# Patient Record
Sex: Female | Born: 1985 | Race: White | Hispanic: No | Marital: Married | Smoking: Never smoker
Health system: Southern US, Community
[De-identification: ages and names within clinical notes are randomized; demographics above are authoritative.]

## PROBLEM LIST (undated history)

## (undated) ENCOUNTER — Inpatient Hospital Stay (HOSPITAL_COMMUNITY): Payer: Self-pay

## (undated) DIAGNOSIS — K819 Cholecystitis, unspecified: Secondary | ICD-10-CM

## (undated) DIAGNOSIS — F419 Anxiety disorder, unspecified: Secondary | ICD-10-CM

## (undated) DIAGNOSIS — L309 Dermatitis, unspecified: Secondary | ICD-10-CM

## (undated) DIAGNOSIS — Z9889 Other specified postprocedural states: Secondary | ICD-10-CM

## (undated) DIAGNOSIS — IMO0002 Reserved for concepts with insufficient information to code with codable children: Secondary | ICD-10-CM

## (undated) DIAGNOSIS — F32A Depression, unspecified: Secondary | ICD-10-CM

## (undated) DIAGNOSIS — E063 Autoimmune thyroiditis: Secondary | ICD-10-CM

## (undated) DIAGNOSIS — Z98891 History of uterine scar from previous surgery: Secondary | ICD-10-CM

## (undated) DIAGNOSIS — K8020 Calculus of gallbladder without cholecystitis without obstruction: Secondary | ICD-10-CM

## (undated) DIAGNOSIS — N390 Urinary tract infection, site not specified: Secondary | ICD-10-CM

## (undated) DIAGNOSIS — G903 Multi-system degeneration of the autonomic nervous system: Secondary | ICD-10-CM

## (undated) DIAGNOSIS — E282 Polycystic ovarian syndrome: Secondary | ICD-10-CM

## (undated) DIAGNOSIS — D649 Anemia, unspecified: Secondary | ICD-10-CM

## (undated) DIAGNOSIS — D696 Thrombocytopenia, unspecified: Secondary | ICD-10-CM

## (undated) DIAGNOSIS — Z6791 Unspecified blood type, Rh negative: Secondary | ICD-10-CM

## (undated) DIAGNOSIS — I951 Orthostatic hypotension: Secondary | ICD-10-CM

## (undated) DIAGNOSIS — O26899 Other specified pregnancy related conditions, unspecified trimester: Secondary | ICD-10-CM

## (undated) DIAGNOSIS — R87619 Unspecified abnormal cytological findings in specimens from cervix uteri: Secondary | ICD-10-CM

## (undated) DIAGNOSIS — R112 Nausea with vomiting, unspecified: Secondary | ICD-10-CM

## (undated) DIAGNOSIS — F329 Major depressive disorder, single episode, unspecified: Secondary | ICD-10-CM

## (undated) DIAGNOSIS — R55 Syncope and collapse: Secondary | ICD-10-CM

## (undated) DIAGNOSIS — O99119 Other diseases of the blood and blood-forming organs and certain disorders involving the immune mechanism complicating pregnancy, unspecified trimester: Secondary | ICD-10-CM

## (undated) DIAGNOSIS — N189 Chronic kidney disease, unspecified: Secondary | ICD-10-CM

## (undated) DIAGNOSIS — Z8619 Personal history of other infectious and parasitic diseases: Secondary | ICD-10-CM

## (undated) HISTORY — PX: DILATION AND CURETTAGE OF UTERUS: SHX78

## (undated) HISTORY — DX: Orthostatic hypotension: I95.1

## (undated) HISTORY — DX: Major depressive disorder, single episode, unspecified: F32.9

## (undated) HISTORY — DX: Depression, unspecified: F32.A

## (undated) HISTORY — DX: Autoimmune thyroiditis: E06.3

## (undated) HISTORY — PX: APPENDECTOMY: SHX54

## (undated) HISTORY — DX: Personal history of other infectious and parasitic diseases: Z86.19

## (undated) HISTORY — DX: Multi-system degeneration of the autonomic nervous system: G90.3

## (undated) HISTORY — DX: Cholecystitis, unspecified: K81.9

## (undated) HISTORY — DX: Dermatitis, unspecified: L30.9

## (undated) HISTORY — DX: Polycystic ovarian syndrome: E28.2

## (undated) HISTORY — DX: Unspecified abnormal cytological findings in specimens from cervix uteri: R87.619

## (undated) HISTORY — DX: Reserved for concepts with insufficient information to code with codable children: IMO0002

## (undated) HISTORY — DX: Chronic kidney disease, unspecified: N18.9

## (undated) HISTORY — DX: Calculus of gallbladder without cholecystitis without obstruction: K80.20

## (undated) HISTORY — DX: Syncope and collapse: R55

---
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
## 2005-07-23 ENCOUNTER — Ambulatory Visit: Payer: Self-pay | Admitting: "Endocrinology

## 2005-10-01 ENCOUNTER — Other Ambulatory Visit: Admission: RE | Admit: 2005-10-01 | Discharge: 2005-10-01 | Payer: Self-pay | Admitting: Obstetrics and Gynecology

## 2009-09-15 ENCOUNTER — Ambulatory Visit: Payer: Self-pay | Admitting: Diagnostic Radiology

## 2009-09-15 ENCOUNTER — Emergency Department (HOSPITAL_BASED_OUTPATIENT_CLINIC_OR_DEPARTMENT_OTHER): Admission: EM | Admit: 2009-09-15 | Discharge: 2009-09-15 | Payer: Self-pay | Admitting: Emergency Medicine

## 2009-12-19 ENCOUNTER — Encounter: Payer: Self-pay | Admitting: Internal Medicine

## 2009-12-26 ENCOUNTER — Ambulatory Visit (HOSPITAL_COMMUNITY): Admission: RE | Admit: 2009-12-26 | Discharge: 2009-12-26 | Payer: Self-pay | Admitting: Obstetrics and Gynecology

## 2010-02-28 ENCOUNTER — Encounter: Payer: Self-pay | Admitting: Internal Medicine

## 2010-03-01 DIAGNOSIS — R5381 Other malaise: Secondary | ICD-10-CM

## 2010-03-01 DIAGNOSIS — F3289 Other specified depressive episodes: Secondary | ICD-10-CM | POA: Insufficient documentation

## 2010-03-01 DIAGNOSIS — R519 Headache, unspecified: Secondary | ICD-10-CM | POA: Insufficient documentation

## 2010-03-01 DIAGNOSIS — E079 Disorder of thyroid, unspecified: Secondary | ICD-10-CM | POA: Insufficient documentation

## 2010-03-01 DIAGNOSIS — R5383 Other fatigue: Secondary | ICD-10-CM

## 2010-03-01 DIAGNOSIS — R51 Headache: Secondary | ICD-10-CM

## 2010-03-01 DIAGNOSIS — F329 Major depressive disorder, single episode, unspecified: Secondary | ICD-10-CM | POA: Insufficient documentation

## 2010-03-01 DIAGNOSIS — J45909 Unspecified asthma, uncomplicated: Secondary | ICD-10-CM | POA: Insufficient documentation

## 2010-03-04 ENCOUNTER — Ambulatory Visit: Payer: Self-pay | Admitting: Cardiology

## 2010-03-04 DIAGNOSIS — I951 Orthostatic hypotension: Secondary | ICD-10-CM | POA: Insufficient documentation

## 2010-03-04 DIAGNOSIS — R002 Palpitations: Secondary | ICD-10-CM | POA: Insufficient documentation

## 2010-03-13 ENCOUNTER — Encounter: Payer: Self-pay | Admitting: Internal Medicine

## 2010-03-15 ENCOUNTER — Encounter: Payer: Self-pay | Admitting: Cardiology

## 2010-03-15 ENCOUNTER — Ambulatory Visit: Payer: Self-pay | Admitting: Internal Medicine

## 2010-03-15 ENCOUNTER — Ambulatory Visit (HOSPITAL_COMMUNITY): Admission: RE | Admit: 2010-03-15 | Discharge: 2010-03-15 | Payer: Self-pay | Admitting: Cardiology

## 2010-03-15 ENCOUNTER — Ambulatory Visit: Payer: Self-pay

## 2010-03-18 ENCOUNTER — Telehealth: Payer: Self-pay | Admitting: Cardiology

## 2010-03-21 ENCOUNTER — Ambulatory Visit: Payer: Self-pay

## 2010-03-21 ENCOUNTER — Ambulatory Visit: Payer: Self-pay | Admitting: Internal Medicine

## 2010-03-21 DIAGNOSIS — R55 Syncope and collapse: Secondary | ICD-10-CM

## 2010-03-27 ENCOUNTER — Encounter: Payer: Self-pay | Admitting: Cardiology

## 2010-03-27 ENCOUNTER — Telehealth (INDEPENDENT_AMBULATORY_CARE_PROVIDER_SITE_OTHER): Payer: Self-pay | Admitting: *Deleted

## 2010-03-27 ENCOUNTER — Ambulatory Visit: Payer: Self-pay

## 2010-03-27 ENCOUNTER — Telehealth: Payer: Self-pay | Admitting: Internal Medicine

## 2010-06-17 ENCOUNTER — Ambulatory Visit: Payer: Self-pay | Admitting: Internal Medicine

## 2010-06-17 DIAGNOSIS — E663 Overweight: Secondary | ICD-10-CM | POA: Insufficient documentation

## 2010-10-21 ENCOUNTER — Ambulatory Visit: Payer: Self-pay | Admitting: Diagnostic Radiology

## 2010-10-21 ENCOUNTER — Observation Stay (HOSPITAL_COMMUNITY): Admission: EM | Admit: 2010-10-21 | Discharge: 2010-10-22 | Payer: Self-pay | Admitting: Emergency Medicine

## 2010-10-21 ENCOUNTER — Encounter: Payer: Self-pay | Admitting: Emergency Medicine

## 2010-10-21 ENCOUNTER — Encounter (INDEPENDENT_AMBULATORY_CARE_PROVIDER_SITE_OTHER): Payer: Self-pay

## 2011-01-09 NOTE — Progress Notes (Signed)
Summary: c/o dizziness  Phone Note Call from Patient Call back at Home Phone [phone] Call back at Work Phone [phone] Call back at c   Caller: Patient Reason for Call: Talk to Nurse Summary of Call: pt not wearing her heart monitor now., it won't connect to phone. what should she do. c/o lighthead, dizziness. pt at work now.  Initial call taken by: Lorne Skeens,  March 27, 2010 2:58 PM  Follow-up for Phone Call        I spoke with the pt and she is sitting in a chair at work and is really dizzy and lightheaded.  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 I told the pt that she could come into our office for an EKG.  The pt said she gets off at 4 and if she is still having symptoms she would have a coworker drive her to our office.  The pt will call back to let me know if she is coming in for an EKG. I instructed the pt to call the customer service number that came with her monitor and they would be able to help her with getting monitor connected to phone line.  Julieta Gutting, RN, BSN  March 27, 2010 3:03 PM  The pt came into the office for a nurse visit. Follow-up by: Julieta Gutting, RN, BSN,  March 27, 2010 4:04 PM

## 2011-01-09 NOTE — Progress Notes (Signed)
  LOV faxed over to [facility] to [phone] [facility]  March 27, 2010 4:34 PM

## 2011-01-09 NOTE — Assessment & Plan Note (Signed)
Summary: per check out/sf   Visit Type:  Follow-up Primary Provider:  Dr. Holley Bouche   History of Present Illness: The patient presents today for routine electrophysiology followup. She reports doing very well since last being seen in our clinic.  Her dizziness resolved with florinef.  She has however noticed fluid retention and bloating with florinef. The patient denies symptoms of palpitations, chest pain, shortness of breath, orthopnea, PND, l dizziness, presyncope, syncope, or neurologic sequela. The patient is tolerating medications without difficulties and is otherwise without complaint today.   Current Medications (verified): 1)  Alprazolam 0.25 Mg Tabs (Alprazolam) .Marland Kitchen.. 1 Tab As Needed 2)  Ventolin Hfa 108 (90 Base) Mcg/act Aers (Albuterol Sulfate) .... Use As Directed As Needed 3)  Fludrocortisone Acetate 0.1 Mg Tabs (Fludrocortisone Acetate) .... One By Mouth Bid 4)  Levothyroxine Sodium 25 Mcg Tabs (Levothyroxine Sodium) .... Once Daily  Allergies: 1)  ! Pcn 2)  ! * Antibiotics 3)  ! Ceclor 4)  ! Macrobid (Nitrofurantoin Monohyd Macro) 5)  ! 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 R knee surgery 2001 D&C after miscarriage 1/11  Social History: Reviewed history from 03/21/2010 and no changes required. Pts lives in [place].  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  Works at [employer].  Graduated from [employer] 2008. Engaged Tobacco Use - No.  Alcohol Use - rare Drug Use - no  Vital Signs:  Patient profile:   25 year old female Height:      61 inches Weight:      166 pounds BMI:     31.48 Pulse rate:   89 / minute BP sitting:   112 / 68  (left arm)  Vitals Entered By: Laurance Flatten CMA (June 17, 2010 3:49 PM)  Physical Exam  General:  Well  developed, well nourished, in no acute distress. Head:  normocephalic and atraumatic Eyes:  PERRLA/EOM intact; conjunctiva and lids normal. Mouth:  Teeth, gums and palate normal. Oral mucosa normal. Neck:  Neck supple, no JVD. No masses, thyromegaly or abnormal cervical nodes. Lungs:  Clear bilaterally to auscultation and percussion. Heart:  Non-displaced PMI, chest non-tender; regular rate and rhythm, S1, S2 without murmurs, rubs or gallops. Carotid upstroke normal, no bruit. Normal abdominal aortic size, no bruits. Femorals normal pulses, no bruits. Pedals normal pulses. No edema, no varicosities. Abdomen:  Bowel sounds positive; abdomen soft and non-tender without masses, organomegaly, or hernias noted. No hepatosplenomegaly. Msk:  Back normal, normal gait. Muscle strength and tone normal. Pulses:  pulses normal in all 4 extremities Extremities:  No clubbing or cyanosis. Neurologic:  Alert and oriented x 3.   Event Monitor  Procedure date:  04/10/2010  Findings:      sinus rhythm no arrhythmias  Impression & Recommendations:  Problem # 1:  ORTHOSTATIC HYPOTENSION (ICD-458.0) Dizziness and orthostasis have resolved with florinef, however she has bloating. We will decrease florinef to 0.1mg  every monday, wed, and friday. She will continue to push fluid and liberalize her salt. She should avoid hot weather.  Problem # 2:  OVERWEIGHT (ICD-278.02) weight loss and regular exercise encouraged  Patient Instructions: 1)  Your physician recommends that you schedule a follow-up appointment in: 3 months with Dr Johney Frame 2)  Your physician has recommended you make the following change in your medication: decrease Florinef to one tablet on M/W/F only

## 2011-01-09 NOTE — Progress Notes (Signed)
Summary: returning call  Phone Note Call from Patient Call back at [phone]    Reason for Call: Talk to Nurse Summary of Call: returning call Initial call taken by: Migdalia Dk,  March 18, 2010 11:17 AM  Follow-up for Phone Call        Phone Call Completed  PT AWARE OF ECHO RESULTS. Follow-up by: Scherrie Bateman, LPN,  March 18, 2010 11:42 AM

## 2011-01-09 NOTE — Assessment & Plan Note (Signed)
Summary: np6/ strong fm hx heart disease, heart failure, heart racing ...   Visit Type:  np 6 Primary Provider:  Dr. Holley Bouche  CC:  chest pain, sob, heart racing , fainting, and weight 45 lb in one yr.  History of Present Illness: [name] comes in today for evaluation and management of history of heart racing, daily she is awake, shortness of breath, low blood pressure, dizziness and presyncope.  She is a 25 year old single white female who comes with her mother today with the above complaints.  She tries to do a treadmill elliptical training 10-20 minutes 3 times a week. She notes her heart races. She's not had syncope.  Her weight can change as much as 3-5 pounds in a day. She is a salt eater. She been gaining weight despite going to [employer].  She gets occasionally dizzy when she stands up too quickly. She has not fainted.  She complains of chronic headaches and atypical chest pain.  She's very concerned about the family history of heart disease. However this not first-degree relatives affected. I reassured her and her mother today she does not carry this shadow.  She is a primary care patient of Dr. Tiburcio Pea. Blood work was essentially normal including a comprehensive metabolic panel, CBC, and TSH. Her free T4 was 0.5 9 which is borderline low. These labs were just checked this past week.  She does have a history of Hashimoto's disease.  She has an uncle who had V. tach and mitral valve prolapse.    Current Medications (verified): 1)  Alprazolam 0.25 Mg Tabs (Alprazolam) .Marland Kitchen.. 1 Tab As Needed 2)  Ventolin Hfa 108 (90 Base) Mcg/act Aers (Albuterol Sulfate) .... Use As Directed As Needed 3)  Cipro 500 Mg Tabs (Ciprofloxacin Hcl) .Marland Kitchen.. 1 Two Times A Day  Allergies (verified): 1)  ! Pcn 2)  ! * Antibiotics 3)  ! Ceclor 4)  ! Macrobid (Nitrofurantoin Monohyd Macro)  Past History:  Past Medical History: Last updated: 03/01/2010 SYNCOPE (ICD-780.2) CORONARY  ARTERY DISEASE, FAMILY HX (ICD-V17.3) DIZZINESS (ICD-780.4) HEADACHE (ICD-784.0) FATIGUE (ICD-780.79) THYROID DISORDER (ICD-246.9) ASTHMA (ICD-493.90) DEPRESSION (ICD-311)    Social History: Last updated: 03/01/2010 Tobacco Use - No.  Alcohol Use - no Drug Use - no  Risk Factors: Smoking Status: never (03/01/2010)  Review of Systems       negative other than history of present illness  Vital Signs:  Patient profile:   25 year old female Height:      61 inches Weight:      164 pounds BMI:     31.10 Pulse rate:   84 / minute BP sitting:   83 / 59  (left arm) Cuff size:   large  Vitals Entered By: Oswald Hillock (March 04, 2010 12:49 PM)  Serial Vital Signs/Assessments:  Time      Position  BP       Pulse  Resp  Temp     By 12:57 PM            83/59    [address]                    Oswald Hillock                     102/68   [address]                    Oswald Hillock  88/65    81                    Oswald Hillock   Physical Exam  General:  obese.  obese.   Head:  normocephalic and atraumatic Eyes:  PERRLA/EOM intact; conjunctiva and lids normal. Neck:  Neck supple, no JVD. No masses, thyromegaly or abnormal cervical nodes. Chest Prosper Paff:  no deformities or breast masses noted Lungs:  Clear bilaterally to auscultation and percussion. Heart:  Non-displaced PMI, chest non-tender; regular rate and rhythm, S1, S2 without murmurs, rubs or gallops. Carotid upstroke normal, no bruit. Normal abdominal aortic size, no bruits. Femorals normal pulses, no bruits. Pedals normal pulses. No edema, no varicosities. Abdomen:  Bowel sounds positive; abdomen soft and non-tender without masses, organomegaly, or hernias noted. No hepatosplenomegaly. Msk:  Back normal, normal gait. Muscle strength and tone normal. Pulses:  pulses normal in all 4 extremities Extremities:  trace left pedal edema and trace right pedal edema.  trace left pedal edema.   Neurologic:   Alert and oriented x 3. Skin:  Intact without lesions or rashes. Psych:  Normal affect.   Problems:  Medical Problems Added: 1)  Dx of Palpitations  (ICD-785.1) 2)  Dx of Orthostatic Hypotension  (ICD-458.0)  EKG  Procedure date:  03/04/2010  Findings:      normal sinus rhythm, normal PR QRS and QTC intervals. T Wave inversion in V3 and biphasic T.'s in V4 through V5.  Impression & Recommendations:  Problem # 1:  PALPITATIONS (ICD-785.1) I suspect these are benign and perhaps related to sinus tachycardia with exercise. I do not think it is associated with any organic heart disease or thyroid disease. I explained that to her mother today. We'll obtain a 2-D echocardiogram. If this is normal reassurance to be given.  Problem # 2:  ORTHOSTATIC HYPOTENSION (ICD-458.0) Her blood pressure is low at baseline. She does increase her heart rate with standing but brings her pressure up initially. I suspect she somewhat dehydrated. Encourage free water and limiting caffeine.  Other Orders: EKG w/ Interpretation (93000) Echocardiogram (Echo)  Patient Instructions: 1)  Your physician recommends that you schedule a follow-up appointment in: as needed 2)  Your physician recommends that you continue on your current medications as directed. Please refer to the Current Medication list given to you today. 3)  Your physician has requested that you have an echocardiogram.  Echocardiography is a painless test that uses sound waves to create images of your heart. It provides your doctor with information about the size and shape of your heart and how well your heart's chambers and valves are working.  This procedure takes approximately one hour. There are no restrictions for this procedure.CUT BACK ON SALT INTAKE 4)  INCREASE WATER INTAKE NO CAFFIENE

## 2011-01-09 NOTE — Consult Note (Signed)
Summary: [facility]   Imported By: Marylou Mccoy 05/15/2010 14:18:08  _____________________________________________________________________  External Attachment:    Type:   Image     Comment:   External Document

## 2011-01-09 NOTE — Consult Note (Signed)
Summary: Tracey Reyes   Imported By: Marylou Mccoy 05/15/2010 14:14:10  _____________________________________________________________________  External Attachment:    Type:   Image     Comment:   External Document

## 2011-01-09 NOTE — Assessment & Plan Note (Signed)
Summary: dizziness  Nurse Visit   Vital Signs:  Patient profile:   25 year old female Pulse rate:   78 / minute Resp:     12 per minute BP sitting:   90 / 68  (left arm) Cuff size:   regular  Vitals Entered By: Julieta Gutting, RN, BSN (March 27, 2010 4:00 PM)  Current Medications (verified): 1)  Alprazolam 0.25 Mg Tabs (Alprazolam) .Marland Kitchen.. 1 Tab As Needed 2)  Ventolin Hfa 108 (90 Base) Mcg/act Aers (Albuterol Sulfate) .... Use As Directed As Needed 3)  Fludrocortisone Acetate 0.1 Mg Tabs (Fludrocortisone Acetate) .... One By Mouth Bid  Allergies: 1)  ! Pcn 2)  ! * Antibiotics 3)  ! Ceclor 4)  ! Macrobid (Nitrofurantoin Monohyd Macro) 5)  ! Sulfa  Impression & Recommendations:  Problem # 1:  ORTHOSTATIC HYPOTENSION (ICD-458.0) The pt came into the office today because she was at work and developed dizziness while bending over.  The pt was concerned because she was not wearing her heart monitor (having difficulty with phone line).  The pt had her mother drive her to our office for an EKG.  EKG shows NSR at 78 bpm.  When reviewing the pt's medications she states that she has not taken her Florinef today or yesterday.  I spoke with Dr Shirlee Latch (DOD) about this pt and he said the pt needs to take her Florinef as instructed, remain hydrated and liberalize salt in her diet.    Julieta Gutting, RN, BSN  March 27, 2010 5:03 PM    Patient Instructions: 1)  Your physician recommends that you continue on your current medications as directed. Please refer to the Current Medication list given to you today. (Take your Florinef as instructed) 2)  Please call the customer service number provided with your heart monitor to discuss issues with phone line.  3)  Remain well hydrated and liberalize the salt in your diet.

## 2011-01-09 NOTE — Consult Note (Signed)
Summary: [facility]   Imported By: Marylou Mccoy 05/15/2010 14:13:39  _____________________________________________________________________  External Attachment:    Type:   Image     Comment:   External Document

## 2011-01-09 NOTE — Assessment & Plan Note (Signed)
Summary: NEP/ APPT IS 1:45/ HYPOTENSION, SYNCOPE / PT HAS [employer]/ GD   Visit Type:  Initial Consult Primary Provider:  Dr. Holley Bouche  CC:  nep/hypotension and syncope.  Marland Kitchen  History of Present Illness: Tracey Reyes is a pleasant 25 yo WF without significant PMH who presents for EP consultation regarding presyncope.  She reports over the past 6 months she has had episodic dizziness.  Episodes have occured when lying and sitting but primarily with standing.    Episodes are most pronounced when changing from supine to standing position.  Episodes are frequently worse in the early morning.  She reports that she has had prior syncope.  This occured while sweeping at work 2-3 months ago.  She  had dizziness and warmth while sweeping and collapsed.  She had an episode of presyncope after receiving novacaine for a dental procedure.  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  She reports heart racing with episodes but notes that dizziness preceeds the racing. She was recently evaluated by Dr Daleen Squibb and documented to have orthostatic hypotension.  A recent echo revealed no structural abnormalities.  She is a primary care patient of Dr. Tiburcio Pea. Recent blood work in his office was essentially normal including a comprehensive metabolic panel, CBC, and TSH. Her free T4 was 0.5 9 which is borderline low.  Upon being seen by Dr Daleen Squibb, aggressive hydration and salt liberalization were advised.  She continues to have these symptoms despite this lifestyle change.  Current Medications (verified): 1)  Alprazolam 0.25 Mg Tabs (Alprazolam) .Marland Kitchen.. 1 Tab As Needed 2)  Ventolin Hfa 108 (90 Base) Mcg/act Aers (Albuterol Sulfate) .... Use As Directed As Needed  Allergies (verified): 1)  ! Pcn 2)  ! * Antibiotics 3)  ! Ceclor 4)  ! Macrobid (Nitrofurantoin Educational psychologist)  Past History:  Past Medical History: RECURRENT SYNCOPE  DIZZINESS (ICD-780.4) H/O  HOSHIMOTO'S THYROIDITIS ASTHMA (ICD-493.90) DEPRESSION (ICD-311) ECZEMA  Past Surgical History: R knee surgery 2001 D&C after miscarriage 1/11  Family History: maternal grandfather had MI  in his late 62s.  Maternal uncle has mitral valve prolapse and ventricular tachycardia.  He has an ICD.  Pts parents and siblings are healthy.  Social History: Pts lives in [place].  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  Works at [employer].  Graduated from [employer] 2008. Engaged Tobacco Use - No.  Alcohol Use - rare Drug Use - no  Review of Systems       All systems are reviewed and negative except as listed in the HPI.  struggles with chronic weight gain,  recurrent UTIs  Vital Signs:  Patient profile:   25 year old female Height:      61 inches Weight:      161 pounds Pulse rate:   88 / minute Pulse (ortho):   80 / minute Pulse rhythm:   regular BP sitting:   94 / 62  (left arm) BP standing:   99 / 72 Cuff size:   regular  Vitals Entered By: Judithe Modest CMA (March 21, 2010 2:33 PM)  Serial Vital Signs/Assessments:  Time      Position  BP       Pulse  Resp  Temp     By 3:42 PM   Lying RA  90/63    70                    Judithe Modest CMA  3:42 PM   Sitting   102/71   78                    Judithe Modest CMA 3:42 PM   Standing  99/72    80                    Judithe Modest CMA  Comments: 3:42 PM 2 minutes--129/92 HR 91 3 minutes--121/93 HR 92  Pt reports dizziness and blurred vision upon sitting and first standing pressure.  Pt then reports shakiness and numbness along with the last  2 standing pressures.   By: Judithe Modest CMA    Physical Exam  General:  overweight, NAD Head:  normocephalic and atraumatic Eyes:  PERRLA/EOM intact; conjunctiva and lids normal. Mouth:  Teeth, gums and palate normal. Oral mucosa normal. Neck:  Neck supple, no JVD. No masses, thyromegaly or abnormal cervical nodes. Lungs:  Clear bilaterally to auscultation and percussion. Heart:   Non-displaced PMI, chest non-tender; regular rate and rhythm, S1, S2 without murmurs, rubs or gallops. Carotid upstroke normal, no bruit. Normal abdominal aortic size, no bruits. Femorals normal pulses, no bruits. Pedals normal pulses. No edema, no varicosities. Abdomen:  Bowel sounds positive; abdomen soft and non-tender without masses, organomegaly, or hernias noted. No hepatosplenomegaly. Msk:  Back normal, normal gait. Muscle strength and tone normal. Pulses:  pulses normal in all 4 extremities Extremities:  No clubbing or cyanosis. Neurologic:  Alert and oriented x 3. Skin:  Intact without lesions or rashes. Cervical Nodes:  no significant adenopathy Psych:  Normal affect.  Echocardiogram  Procedure date:  03/15/2010  Findings:       Study Conclusions            - Left ventricle: Systolic function was vigorous. The estimated       ejection fraction was in the range of 65% to 70%. Wall motion was       normal; there were no regional wall motion abnormalities. Features       are consistent with a pseudonormal left ventricular filling       pattern, with concomitant abnormal relaxation and increased       filling pressure (grade 2 diastolic dysfunction).     - Aortic valve: Valve area: 1.86cm 2(VTI). Valve area: 1.7cm 2       (Vmax).     Transthoracic echocardiography. M-mode, complete 2D, spectral     Doppler, and color Doppler. Height: Height: 154.9cm. Height: 61in.     Weight: Weight: 74.4kg. Weight: 163.7lb. Body mass index: BMI:     31kg/m 2. Body surface area: BSA: 1.37m 2. Blood pressure: 122/83.     Patient status: Outpatient. Location: [facility]            --------------------------------------------------------------------            --------------------------------------------------------------------     Left ventricle: Systolic function was vigorous. The estimated     ejection fraction was in the range of 65% to 70%. Wall motion was     normal; there were no  regional wall motion abnormalities. Features     are consistent with a pseudonormal left ventricular filling pattern,     with concomitant abnormal relaxation and increased filling pressure     (grade 2 diastolic dysfunction).            --------------------------------------------------------------------     Aortic valve: Structurally normal valve. Cusp separation was normal.     Doppler: Transvalvular velocity  was within the normal range. There     was no stenosis. No regurgitation.  Valve area: 1.86cm 2(VTI).     Indexed valve area: 1.07cm 2/m 2 (VTI). Valve area: 1.7cm 2 (Vmax).     Indexed valve area: 0.98cm 2/m 2 (Vmax).  Mean gradient: 4mm Hg (S).            --------------------------------------------------------------------     Aorta: Aortic root: The aortic root was normal in size     EKG  Procedure date:  03/04/2010  Findings:      sinus rhythm 84 bpm, PR 136, QRS 78, QT 410, nonspecific ST/T changes  Impression & Recommendations:  Problem # 1:  SYNCOPE (ICD-780.2) The patient reports having prior syncope.  Her symptoms are consistent with postural syncope, likely due to dysautonomia.  Though she has maternal relatives with a h/o VT, her EKG and echo are benign.  She continues to exhibit orthostasis on exam with reproduction of symptoms.  I therefore do not feel that a tilt study would add additional information at this time.  As lifestyle modification with increased salt and hydration have been ineffective, I will add florinef 0.1mg  two times a day at this time. I will also place an event monitor to evaluate for arrhythmia, though I think this is unlikely. The patient is advised to refrain from driving until symptoms are resolved.  Problem # 2:  ORTHOSTATIC HYPOTENSION (ICD-458.0) as above add florinef  Other Orders: Holter Monitor (Holter Monitor)  Patient Instructions: 1)  Your physician recommends that you schedule a follow-up appointment in: 6 weeks with Dr  Johney Frame 2)  Your physician has recommended that you wear an event monitor.  Event monitors are medical devices that record the heart's electrical activity. Doctors most often use these monitors to diagnose arrhythmias. Arrhythmias are problems with the speed or rhythm of the heartbeat. The monitor is a small, portable device. You can wear one while you do your normal daily activities. This is usually used to diagnose what is causing palpitations/syncope (passing out). 3)  Your physician has recommended you make the following change in your medication: start Florinef 0.1mg  two times a day Prescriptions: FLUDROCORTISONE ACETATE 0.1 MG TABS (FLUDROCORTISONE ACETATE) one by mouth bid  #60 x 3   Entered by:   Dennis Bast, RN, BSN   Authorized by:   Hillis Range, MD   Signed by:   Dennis Bast, RN, BSN on 03/21/2010   Method used:   Electronically to        [id number] (retail)       [address]       [place], [place]  [address]       Ph: [phone] or [phone]       Fax: [phone]   RxID:   [id number]

## 2011-01-09 NOTE — Consult Note (Signed)
Summary: [facility]   Imported By: Marylou Mccoy 05/15/2010 14:18:49  _____________________________________________________________________  External Attachment:    Type:   Image     Comment:   External Document

## 2011-02-18 LAB — URINE CULTURE
Colony Count: 9000
Culture  Setup Time: 201111142133

## 2011-02-18 LAB — URINALYSIS, ROUTINE W REFLEX MICROSCOPIC
Bilirubin Urine: NEGATIVE
Glucose, UA: NEGATIVE mg/dL
Ketones, ur: NEGATIVE mg/dL
Nitrite: NEGATIVE
Protein, ur: NEGATIVE mg/dL
Specific Gravity, Urine: 1.022 (ref 1.005–1.030)
Urobilinogen, UA: 0.2 mg/dL (ref 0.0–1.0)
pH: 7 (ref 5.0–8.0)

## 2011-02-18 LAB — CBC
HCT: 43 % (ref 36.0–46.0)
MCH: 29.6 pg (ref 26.0–34.0)
MCV: 86.5 fL (ref 78.0–100.0)
Platelets: 179 10*3/uL (ref 150–400)
RDW: 11.4 % — ABNORMAL LOW (ref 11.5–15.5)
WBC: 18 10*3/uL — ABNORMAL HIGH (ref 4.0–10.5)

## 2011-02-18 LAB — COMPREHENSIVE METABOLIC PANEL
ALT: 21 U/L (ref 0–35)
Albumin: 4.4 g/dL (ref 3.5–5.2)
BUN: 14 mg/dL (ref 6–23)
CO2: 26 mEq/L (ref 19–32)
Calcium: 9.7 mg/dL (ref 8.4–10.5)
Chloride: 102 mEq/L (ref 96–112)
Creatinine, Ser: 0.9 mg/dL (ref 0.4–1.2)
GFR calc Af Amer: >60 mL/min (ref >60)
GFR calc non Af Amer: >60 mL/min (ref >60)
Potassium: 3.6 mEq/L (ref 3.5–5.1)
Total Protein: 7.8 g/dL (ref 6.0–8.3)

## 2011-02-18 LAB — PREGNANCY, URINE: Preg Test, Ur: NEGATIVE

## 2011-02-18 LAB — DIFFERENTIAL
Eosinophils Absolute: 0.2 10*3/uL (ref 0.0–0.7)
Eosinophils Relative: 1 % (ref 0–5)
Neutro Abs: 14.4 10*3/uL — ABNORMAL HIGH (ref 1.7–7.7)
Neutrophils Relative %: 80 % — ABNORMAL HIGH (ref 43–77)

## 2011-02-18 LAB — WET PREP, GENITAL
Clue Cells Wet Prep HPF POC: NONE SEEN
Trich, Wet Prep: NONE SEEN
Yeast Wet Prep HPF POC: NONE SEEN

## 2011-02-18 LAB — URINE MICROSCOPIC-ADD ON

## 2011-02-24 LAB — ABO/RH: ABO/RH(D): B NEG

## 2011-02-24 LAB — CBC
MCHC: 33.4 g/dL (ref 30.0–36.0)
MCV: 89 fL (ref 78.0–100.0)
Platelets: 221 10*3/uL (ref 150–400)
RBC: 5.06 MIL/uL (ref 3.87–5.11)

## 2011-02-24 LAB — RH IMMUNE GLOBULIN WORKUP (NOT WOMEN'S HOSP): ABO/RH(D): B NEG

## 2011-04-01 ENCOUNTER — Other Ambulatory Visit (HOSPITAL_COMMUNITY): Payer: Self-pay | Admitting: Obstetrics and Gynecology

## 2011-04-01 DIAGNOSIS — N6314 Unspecified lump in the right breast, lower inner quadrant: Secondary | ICD-10-CM

## 2011-04-03 ENCOUNTER — Other Ambulatory Visit: Payer: Self-pay

## 2011-04-04 ENCOUNTER — Ambulatory Visit
Admission: RE | Admit: 2011-04-04 | Discharge: 2011-04-04 | Disposition: A | Discharge status: Home / Self Care | Payer: BC Managed Care – PPO | Source: Ambulatory Visit | Attending: Obstetrics and Gynecology | Admitting: Obstetrics and Gynecology

## 2011-04-04 DIAGNOSIS — N6314 Unspecified lump in the right breast, lower inner quadrant: Secondary | ICD-10-CM

## 2011-04-20 IMAGING — CT CT ABD-PELV W/ CM
2 of 4 series · 17 of 46 positions shown, 19 images · IV contrast (APPLIED)
Comparison: None.

CLINICAL DATA: Right lower quadrant abdominal pain.  Fever.
Nausea.  Chills.  Leukocytosis.  Negative urine pregnancy test.

CT ABDOMEN AND PELVIS WITH CONTRAST 10/21/2010:
TECHNIQUE: Multidetector CT imaging of the abdomen and pelvis was
performed following the standard protocol during bolus
administration of intravenous contrast.
Contrast: 100 ml [id number] IV.  Water utilized as oral
contrast.

[Series 2: abd/pelvis 5.0 b31f · axial · 0.88mm/px · z∈[-475,-25]mm · 14 of 100 slices shown, 16 images]
[im 5/100  soft-tissue]
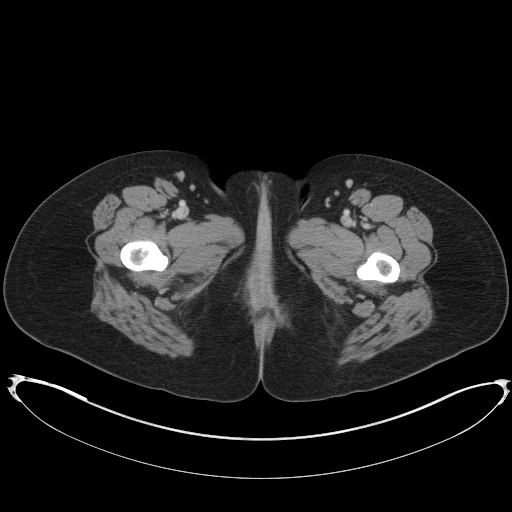
[im 5/100  bone]
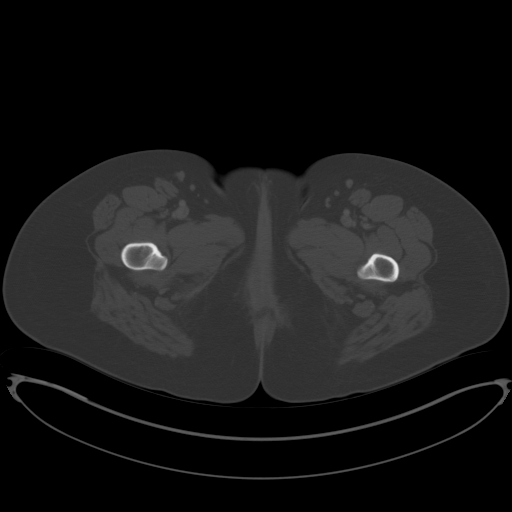
[im 14/100  soft-tissue]
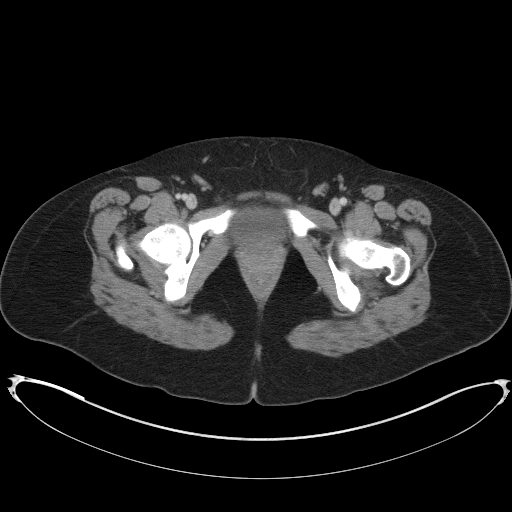
[im 19/100  soft-tissue]
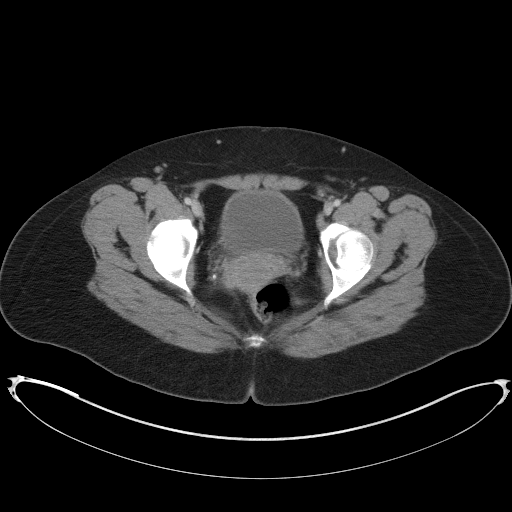
[im 28/100  soft-tissue]
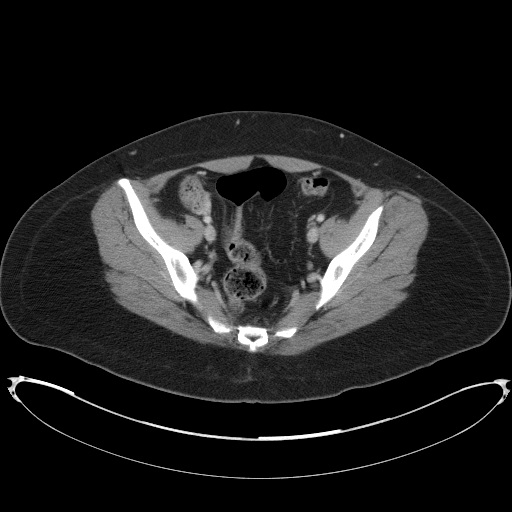
[im 32/100  soft-tissue]
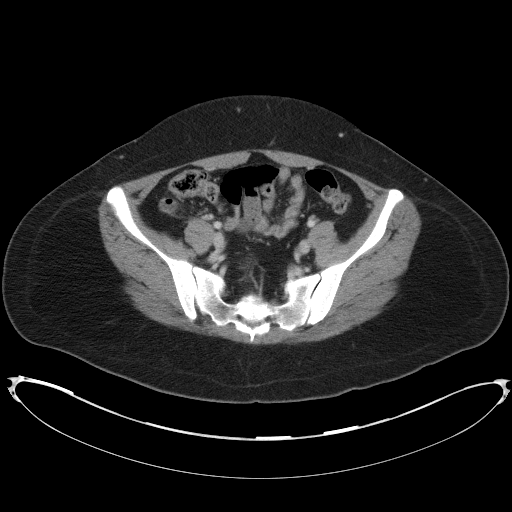
[im 41/100  soft-tissue]
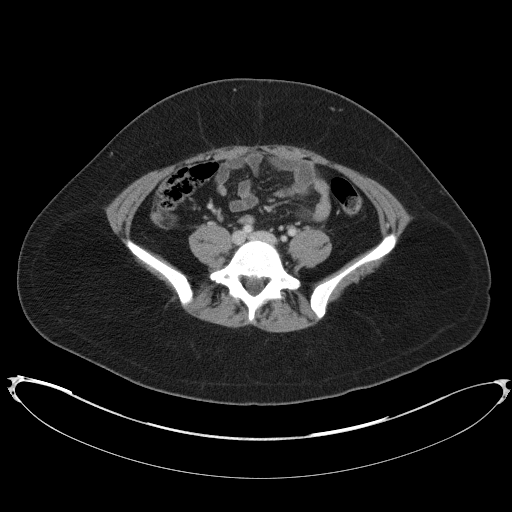
[im 46/100  soft-tissue]
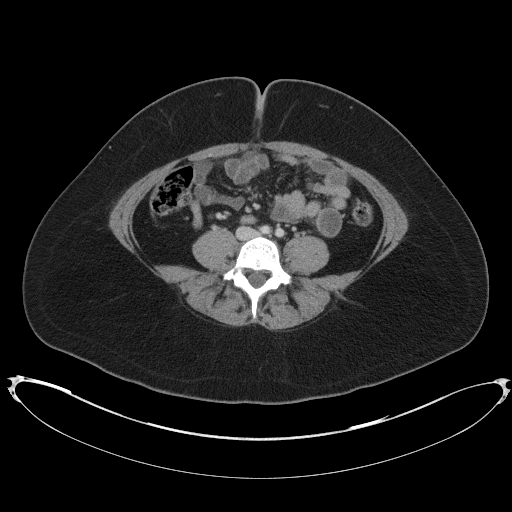
[im 55/100  soft-tissue]
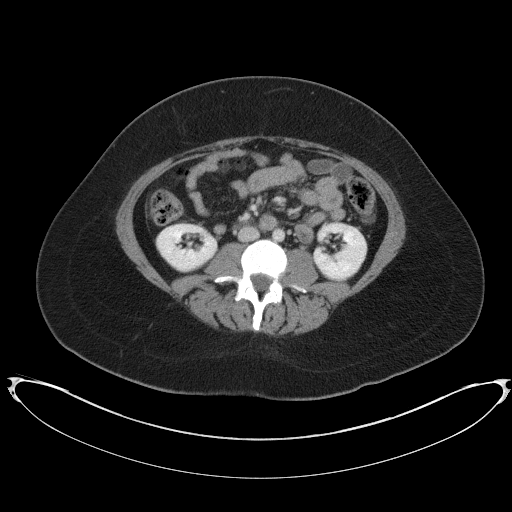
[im 59/100  soft-tissue]
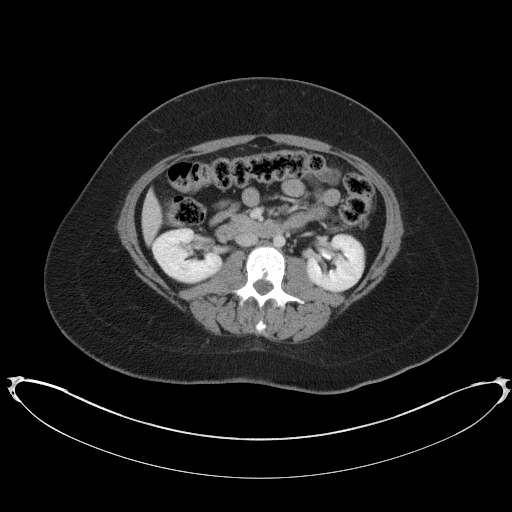
[im 59/100  bone]
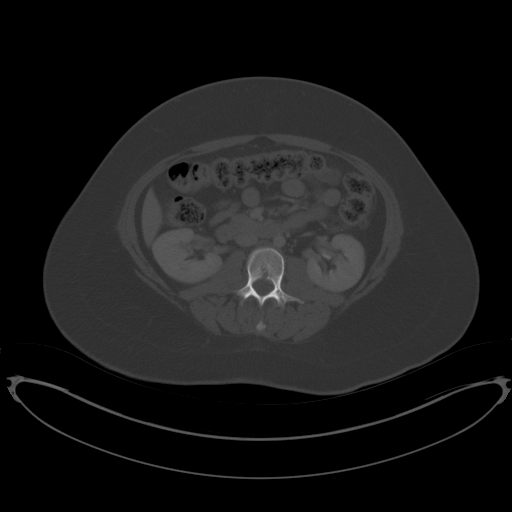
[im 68/100  soft-tissue]
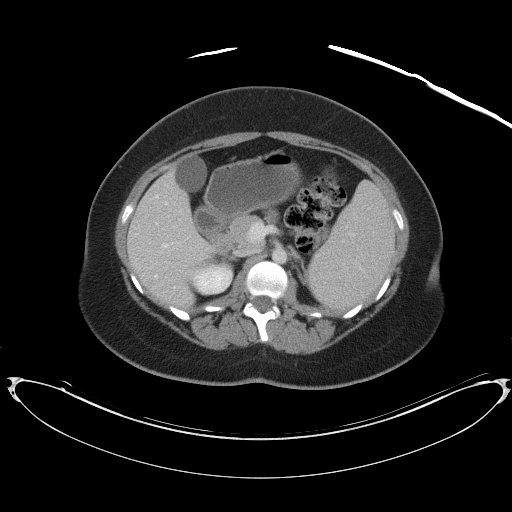
[im 73/100  soft-tissue]
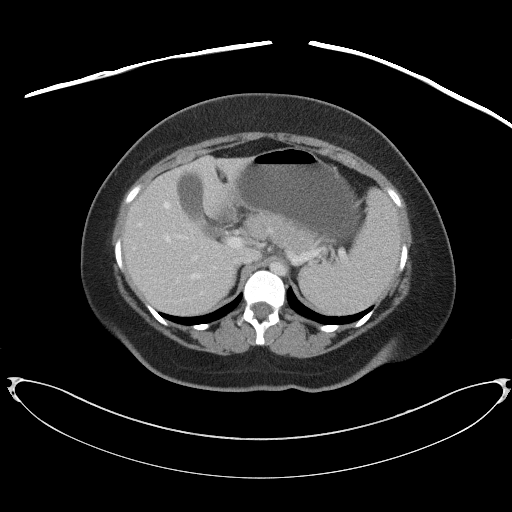
[im 82/100  soft-tissue]
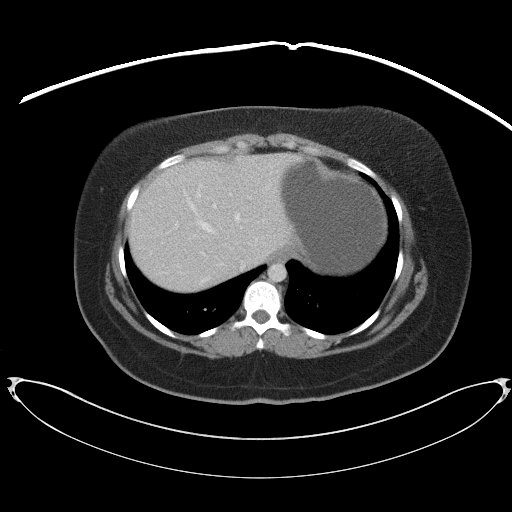
[im 86/100  soft-tissue]
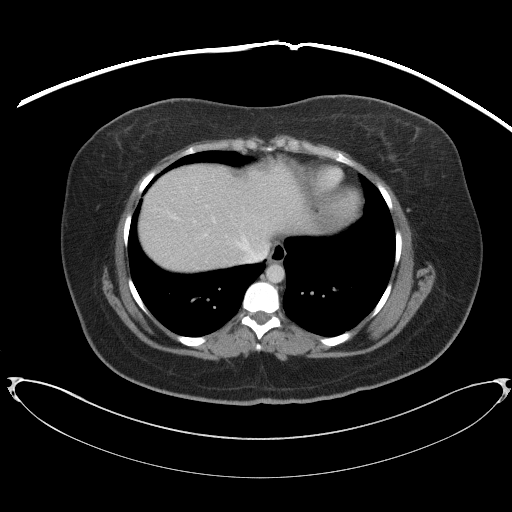
[im 95/100  soft-tissue]
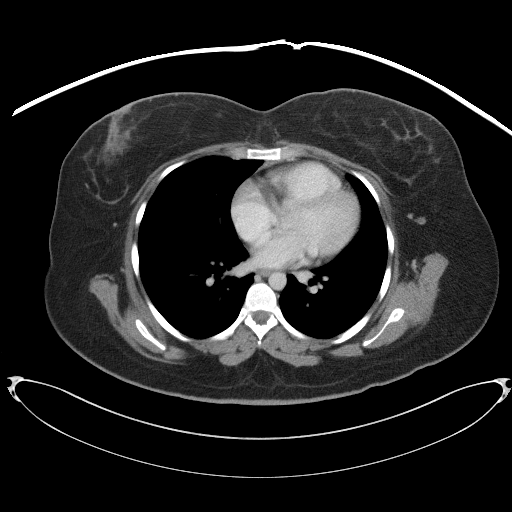

[Series 5: abd/pelvis 3.0 coronal · coronal · 0.97mm/px · 3 of 98 slices shown]
[im 33/98  soft-tissue]
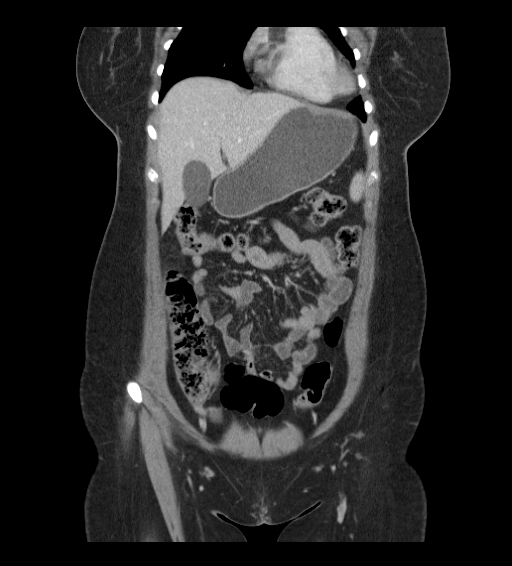
[im 44/98  soft-tissue]
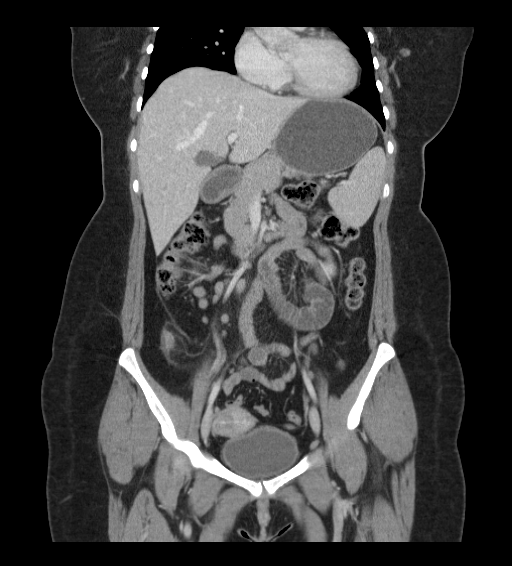
[im 54/98  soft-tissue]
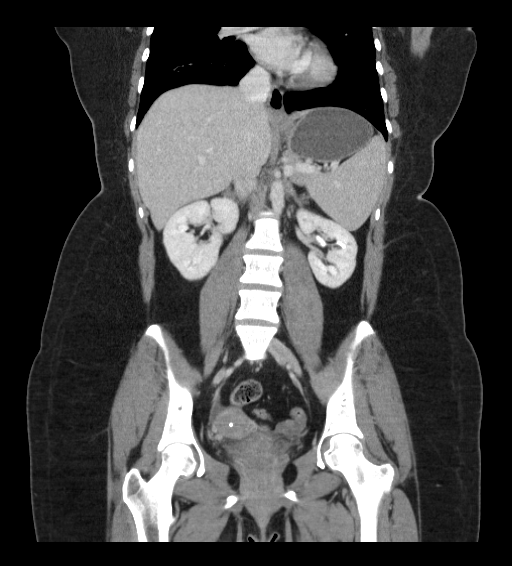

[17 of 46 positions shown; findings below may reference images not displayed]

FINDINGS: Dilated, fluid-filled appendix measuring up to
approximately 13 mm.  Enhancement of the appendiceal mucosa.
Periappendiceal inflammation and edema.  No abnormal fluid
collection to suggest abscess.  No extraluminal gas.  Small
reactive lymph nodes in the adjacent mesentery.

Normal appearing liver, spleen, pancreas, and adrenal glands.
Normal left kidney.  Incomplete rotation of the normal-appearing
right kidney.  Stomach, small bowel, and colon normal in
appearance.  No ascites.  No significant lymphadenopathy.  No
visible atherosclerosis.

Urinary bladder decompressed unremarkable.  Normal appearing uterus
containing an intrauterine device.  Normal-appearing ovaries for
age.  No free pelvic fluid.  Bone window images unremarkable.
Visualized lung bases clear.  Heart size normal.
IMPRESSION: 1.  Acute appendicitis.  No evidence of periappendiceal abscess.
2.  No significant abnormalities otherwise.

Critical test results telephoned to Dr. Casale of the emergency
department by me at the time of interpretation on 10/21/2010 at
2562 hours.

## 2011-04-25 NOTE — Op Note (Signed)
[place]  Patient:    Tracey Reyes, Tracey Reyes                 MRN: [id number] Proc. Date: 03/02/01 Adm. Date:  [id number] Attending:  Marcene Corning                           Operative Report  PREOPERATIVE DIAGNOSIS:  Right knee chondromalacia of the patella.  POSTOPERATIVE DIAGNOSIS:  Right knee chondromalacia of the patella.  PROCEDURES: 1. Right knee arthroscopic chondroplasty. 2. Right knee arthroscopic lateral release.  ANESTHESIA:  General.  SURGEON:  Lubertha Basque. Jerl Santos, M.D.  ASSISTANT:  Prince Rome, P.A.  INDICATION FOR PROCEDURE:  The patient is a 25 year old girl with a very long history of anterior knee pain, right worse than left.  This has persisted despite oral anti-inflammatories, bracing, and a long course of physical therapy.  At this point, her knee is limiting her activities, and she has left discomfort as well.  She has been offered operative intervention to consist of an arthroscopy.  The procedure was discussed with the patient and her mother, and informed operative consent was obtained after discussion of the possible complications of, reaction to anesthesia, and infection.  DESCRIPTION OF PROCEDURE:  The patient was taken to the operating suite, where general anesthetic was applied without difficulty.  She was positioned supine and prepped and draped in normal sterile fashion.  After the administration of preoperative IV antibiotics, an arthroscopy of the right knee was performed through a total of three portals.  Suprapatellar pouch was benign, while the patellofemoral joint exhibited some significant softening of the medial patellar facet.  It did track in a lateral position.  The rest of the joint was examined and was normal.  The medial and lateral compartments exhibited no evidence of meniscal or articular cartilage injury.  The ACL and PCL were intact.  Through the additional portal, an arthroscopic  lateral release was performed.  Care was taken to perform Bovie cautery of any bleeding vessels seen.  Once this was accomplished, the patella tracked in a normal position. The knee was thoroughly irrigated at the end of the case, followed by placement of Marcaine with epinephrine and morphine.  Adaptic was placed over the portals, followed by dry gauze and a loose Ace wrap.  Estimated blood loss and intraoperative fluids can be obtained from the anesthesia records.  DISPOSITION:  The patient was extubated in the operating room and taken to the recovery room in stable condition.  Plans were for her to go home the same day and to follow up in the office in less than a week.  I will contact her by phone tonight. DD:  03/02/01 TD:  03/02/01 Job: [id number]

## 2011-06-12 ENCOUNTER — Other Ambulatory Visit: Payer: Self-pay | Admitting: Gastroenterology

## 2011-06-12 DIAGNOSIS — R1013 Epigastric pain: Secondary | ICD-10-CM

## 2011-06-13 ENCOUNTER — Encounter: Payer: Self-pay | Admitting: Internal Medicine

## 2011-06-18 ENCOUNTER — Other Ambulatory Visit: Payer: BC Managed Care – PPO

## 2011-06-19 ENCOUNTER — Other Ambulatory Visit: Payer: BC Managed Care – PPO

## 2011-07-10 ENCOUNTER — Ambulatory Visit
Admission: RE | Admit: 2011-07-10 | Discharge: 2011-07-10 | Disposition: A | Discharge status: Home / Self Care | Payer: 59 | Source: Ambulatory Visit | Attending: Gastroenterology | Admitting: Gastroenterology

## 2011-07-10 DIAGNOSIS — R1013 Epigastric pain: Secondary | ICD-10-CM

## 2011-07-17 ENCOUNTER — Ambulatory Visit (INDEPENDENT_AMBULATORY_CARE_PROVIDER_SITE_OTHER): Payer: 59 | Admitting: Surgery

## 2011-07-17 ENCOUNTER — Encounter (INDEPENDENT_AMBULATORY_CARE_PROVIDER_SITE_OTHER): Payer: Self-pay | Admitting: Surgery

## 2011-07-17 VITALS — BP 104/76 | HR 76 | Temp 97.8°F | Ht 60.75 in | Wt 165.0 lb

## 2011-07-17 DIAGNOSIS — K8020 Calculus of gallbladder without cholecystitis without obstruction: Secondary | ICD-10-CM

## 2011-07-17 NOTE — Progress Notes (Addendum)
Chief Complaint  Patient presents with  . Other    urg- gallstones    Tracey Reyes is a 25 y.o. white female who is a patient of HARRIS,RANDALL, MD (also sees Dr. Marcy Siren) and comes to me today for gall bladder disease.  The patient has had trouble with reflux disease which began last fall. She had a laparoscopic appendectomy by Dr. Derrell Lolling on 21 October 2010.  It seemed for a while that her reflux disease got better after the appendectomy.  She then developed worsening bloating and stomach cramps. Pasta and bread seemed to precipitate these symptoms the most.  She was referred to see Dr. Wandalee Ferdinand in early July. He obtained an ultrasound that showed gallstones. She is also set up for an upper endoscopy by Dr. Evette Cristal, Wednesday, 23 July 2011.  She had sushi last PM and had a severe abdominal cramps.  The pain was mid epigastric.  Gastrointestinal history: She has some test to evaluate her for celiac disease. These are reported as negative. She has no history of liver disease, pancreas disease, or colon disease. Her father and grandfather both had gallbladder disease.  Past Medical History  Diagnosis Date  . Orthostatic hypotension   . 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 Thyroiditis     hoshimoto's; hx   . Asthma   . Depression   . Eczema   . Fever   . Weight increase   . Chills   . PCOS (polycystic ovarian syndrome)   . Sleep difficulties   . Dizziness   . Abdominal pain   . Nausea & vomiting     Past Surgical History  Procedure Date  . Anterior cruciate ligament repair 2001  . Dilation and curettage of uterus     after miscarriage 1/11  . Appendectomy     Current Outpatient Prescriptions  Medication Sig Dispense Refill  . albuterol (VENTOLIN HFA) 108 (90 BASE) MCG/ACT inhaler Inhale into the lungs as directed. Prn        . IBUPROFEN PO Take by mouth as needed.          Allergies  Allergen Reactions  . Cefaclor     REACTION:  CECLOR  . Dexilant   . Macrobid   . Nitrofurantoin   . Penicillins   . Sulfonamide Derivatives    Review of Systems: Skin:  No history of rash.  No history of abnormal moles. Infection:  No history of hepatitis or HIV.  No history of MRSA. Neurologic:  No history of stroke.  No history of seizure.  No history of headaches. Cardiac:  No history of hypertension. No history of heart disease.   No history of seeing a cardiologist. Pulmonary:  Does not smoke cigarettes.  No asthma or bronchitis.  No OSA/CPAP.  Endocrine:  No diabetes. No thyroid disease. Gastrointestinal: See HPI. Urologic:  No history of kidney stones.  Has had multiple UTI's.  Has seen Dr. Aldean Ast since age 54. GYN:  Miscarried 2 years ago.  Diagnosed with PCOS by Dr. [name] ([facility]). Musculoskeletal:  No history of joint or back disease. Hematologic:  No bleeding disorder.  No history of anemia.  Not anticoagulated.  SOCIAL and FAMILY HISTORY: Married x 1 year.  Works for [employer] - in [employer].  PHYSICAL EXAM: BP 104/76  Pulse 76  Temp(Src) 97.8 F (36.6 C) (Temporal)  Ht 5' 0.75" (1.543 m)  Wt 165 lb (74.844 kg)  BMI 31.43 kg/m2  HEENT: Normal. Pupils equal. Normal dentition.  Stud in right nares. Neck: Supple. No thyroid mass. Lymph Nodes:  No supraclavicular or cervical nodes. Lungs: Clear and symmetric. Heart:  RRR. No murmur.  Abdomen: No mass. Soreness in left abdomen.  No hernia. Normal bowel sounds.  Scar from umbilical ring and lap appendectomy. Rectal: Not done. Extremities:  Good strength in upper and lower extremities. Neurologic:  Grossly intact to motor and sensory function.  DATA REVIEWED: Notes from Dr. Evette Cristal.  ASSESSMENT and PLAN: 1.  Symptomatic gall bladder disease.  I discussed with the patient the indications and risks of gall bladder surgery.  The primary risks of gall bladder surgery include, but are not limited to, bleeding, infection, common bile duct  injury, and open surgery.  There is also the risk that the patient may have continued symptoms after surgery. I tried to answer the patient's questions. I gave the patient literature about gall bladder surgery.  We also talked a little about Actigall.  She is to undergo an upper endoscopy by Dr. Evette Cristal and will make a decision after that.  [She called back and said upper endo by Dr. Evette Cristal was negative.  She wants to be treated with Actigall because of not being able to miss time with her job.  She will need KUB (to see if stones are radiolucent), CMP (ifnot done), and f/u US in 6 months (dn 07/25/11)]  [KUB shows no radio-opaque stones, CMP okay, treat with actigall for 6 months, then repeat US and office visit  (dn 07/28/11)]  [She's trying to get pregnant.  Is talked about taking Clomid for fertility.  She see Dr. Billy Coast from a Ob standpoint.  She's had two miscarriages.  Wants to know what to do about the GB.  She is afraid of surgery.  The risks of keeping the GB and stone are that she will have trouble during her pregnancy.  I discussed the options on the phone with the patient.  DN 08/28/2011.]  2.  PCOS 3.  History of multiple UTI's. 4.  Multiple allergies. 5.  Asthma. 6.  Anxiety.

## 2011-07-17 NOTE — Patient Instructions (Signed)
You have literature on gall bladder disease.  Avoid fatty/greasy/spicey foods.  Drink plenty of water.  You have the endo scheduled with Dr. Evette Cristal.  Call us back to schedule GB surgery, when you are ready.

## 2011-07-24 ENCOUNTER — Telehealth (INDEPENDENT_AMBULATORY_CARE_PROVIDER_SITE_OTHER): Payer: Self-pay

## 2011-07-24 NOTE — Telephone Encounter (Signed)
Pt called and stated that she had her endoscopy yesterday and it was negative.  She wants to know about a medication (?Actigall) that she can take to avoid surgery.  She can't have surgery this year due to her new job and finances.  She wants to wait until after the new year to think about surgery.  I called and requested the endoscopy report and will address this with Dr Ezzard Standing.  We will call the patient back.

## 2011-07-25 ENCOUNTER — Telehealth (INDEPENDENT_AMBULATORY_CARE_PROVIDER_SITE_OTHER): Payer: Self-pay

## 2011-07-25 ENCOUNTER — Other Ambulatory Visit (INDEPENDENT_AMBULATORY_CARE_PROVIDER_SITE_OTHER): Payer: Self-pay | Admitting: Surgery

## 2011-07-25 DIAGNOSIS — K8020 Calculus of gallbladder without cholecystitis without obstruction: Secondary | ICD-10-CM

## 2011-07-25 NOTE — Telephone Encounter (Signed)
I notified pt that Dr Ezzard Standing wants to order a KUB and lab work prior to starting the Actigall.  She wants to get the CMP drawn at her work so I will fax the order to her attention at [phone].  Once these results are  in and Dr Ezzard Standing reviews them, we will call in the medication.

## 2011-07-28 ENCOUNTER — Ambulatory Visit
Admission: RE | Admit: 2011-07-28 | Discharge: 2011-07-28 | Disposition: A | Discharge status: Home / Self Care | Payer: 59 | Source: Ambulatory Visit | Attending: Surgery | Admitting: Surgery

## 2011-07-28 ENCOUNTER — Ambulatory Visit (INDEPENDENT_AMBULATORY_CARE_PROVIDER_SITE_OTHER): Payer: Self-pay | Admitting: General Surgery

## 2011-07-28 ENCOUNTER — Telehealth (INDEPENDENT_AMBULATORY_CARE_PROVIDER_SITE_OTHER): Payer: Self-pay

## 2011-07-28 DIAGNOSIS — K8020 Calculus of gallbladder without cholecystitis without obstruction: Secondary | ICD-10-CM

## 2011-07-28 NOTE — Telephone Encounter (Signed)
Dr Ezzard Standing called and informed me her xray was ok.  Her labs are normal.  She can start Actigall 300mg  bid x83mos.  F/u in February with repeat abd [place] prior to office visit.   I notified the pt and called in the Actigall to [facility] [phone]

## 2011-08-26 ENCOUNTER — Telehealth (INDEPENDENT_AMBULATORY_CARE_PROVIDER_SITE_OTHER): Payer: Self-pay

## 2011-08-26 NOTE — Telephone Encounter (Signed)
Pt called and left me a voicemail that she saw Dr Billy Coast to discuss infertility treatment.  He wanted her to talk to you about if she can take medication for that with her GB issue or does she need to go ahead and have surgery 1st?  He wants to prescribe Clomid.  She is doing on the Actigall and is not as bloated.  Dr Billy Coast told her the gallstones could get worse.   Her work # is [phone] ext [phone] or cell [id number].

## 2011-08-29 NOTE — Telephone Encounter (Signed)
Dr Ezzard Standing spoke to the pt 08-28-11

## 2011-10-29 ENCOUNTER — Ambulatory Visit: Payer: 59 | Admitting: Internal Medicine

## 2011-12-11 ENCOUNTER — Telehealth (INDEPENDENT_AMBULATORY_CARE_PROVIDER_SITE_OTHER): Payer: Self-pay | Admitting: Surgery

## 2011-12-16 ENCOUNTER — Other Ambulatory Visit (INDEPENDENT_AMBULATORY_CARE_PROVIDER_SITE_OTHER): Payer: Self-pay

## 2011-12-16 DIAGNOSIS — R1013 Epigastric pain: Secondary | ICD-10-CM

## 2011-12-23 ENCOUNTER — Ambulatory Visit
Admission: RE | Admit: 2011-12-23 | Discharge: 2011-12-23 | Disposition: A | Discharge status: Home / Self Care | Payer: 59 | Source: Ambulatory Visit | Attending: Surgery | Admitting: Surgery

## 2011-12-23 DIAGNOSIS — R1013 Epigastric pain: Secondary | ICD-10-CM

## 2012-01-08 ENCOUNTER — Telehealth (INDEPENDENT_AMBULATORY_CARE_PROVIDER_SITE_OTHER): Payer: Self-pay

## 2012-01-08 NOTE — Telephone Encounter (Signed)
The patient called and asked for refill of Actigall generic to be called to [facility] [phone].  She takes 300mg  bid.  The pt is coming in next week.  I called in 300mg  bid Actigall generic allowed to [facility] with only #30 and no refills to last her until we examine her next week.

## 2012-01-15 ENCOUNTER — Encounter (INDEPENDENT_AMBULATORY_CARE_PROVIDER_SITE_OTHER): Payer: Self-pay | Admitting: Surgery

## 2012-01-15 ENCOUNTER — Ambulatory Visit (INDEPENDENT_AMBULATORY_CARE_PROVIDER_SITE_OTHER): Payer: 59 | Admitting: Surgery

## 2012-01-15 VITALS — BP 118/64 | HR 84 | Temp 97.0°F | Resp 18 | Ht 61.0 in | Wt 171.5 lb

## 2012-01-15 DIAGNOSIS — K829 Disease of gallbladder, unspecified: Secondary | ICD-10-CM | POA: Insufficient documentation

## 2012-01-15 NOTE — Progress Notes (Signed)
[facility]  Ovidio Kin, MD,  FACS [address] [place]  [address] [place], [place]    [address] Phone:  [phone] FAX:  [phone]   Re:   Tracey Reyes DOB:   02-Mar-1986 MRN:   [id number]  ASSESSMENT AND PLAN: 1. Symptomatic gall bladder disease.   She was very anxious about surgery when I first met her and she has tried to manage this medically, with the help actigall.  She was recently thinking about having GB surgery, but now she is pregnant, so this will be put on hold.  She has taken actigall (300 mg BID) for about 6 months and this has controlled her symptoms.  He [place] on 12/23/2011 showed 4 stones.  By measurement, the stone was 10 mm in August 2012 and 9 mm on the current study.  I gave her a copy of the [place].  So the stones are not bigger, but I am not sure how much smaller they really are.  Because of her pregnancy, she will stop the actigall.  I do not need to see her during her pregnancy, unless she has trouble.  I will see her 2 or three months post delivery to discuss probable cholecystectomy.  2. PCOS  3. History of multiple UTI's.  4. Multiple allergies.  5. Asthma.  6. Anxiety. 7.  She found out she was pregnant today.  She sees Dr. Billy Coast.  I printed the FDA warning for actigall during pregnancy - category B - for her.  There is no known problem, but they just advise against using it.  We agreed that it is probably best that she not take the actigall during the pregnancy.  She has already had 2 miscarriages.  HISTORY OF PRESENT ILLNESS: Chief Complaint  Patient presents with  . Follow-up    reck GB    Tracey Reyes is a 26 y.o. (DOB: 05/06/1986)  white female who is a patient of Tiburcio Pea, Osborn Coho, MD, MD and comes to me today for follow up of gall stones and gall bladder disease.  She actually is doing very well with the actigall to manage her symptoms.  Her US showed no change in the size of the stones.  But today she found  out she is pregnant.  Review of System: Urologic: No history of kidney stones. Has had multiple UTI's. Has seen Dr. Aldean Ast since age 13.  GYN: Miscarried 2 years ago. Diagnosed with PCOS by Dr. [name] ([facility]).  PHYSICAL EXAM: BP 118/64  Pulse 84  Temp(Src) 97 F (36.1 C) (Temporal)  Resp 18  Ht 5\' 1"  (1.549 m)  Wt 171 lb 8.3 oz (77.801 kg)  BMI 32.41 kg/m2  Abdomen:  Soft.  BS positive. Umbilical ring.  No mass or tenderness.  DATA REVIEWED: I gave her copies of her [place] report. I gave her copies of the FDA warning for pregnancy.  Ovidio Kin, MD, FACS Office:  [phone]

## 2012-02-12 LAB — OB RESULTS CONSOLE RPR: RPR: NONREACTIVE

## 2012-02-12 LAB — OB RESULTS CONSOLE HEPATITIS B SURFACE ANTIGEN: Hepatitis B Surface Ag: NEGATIVE

## 2012-03-07 ENCOUNTER — Inpatient Hospital Stay (HOSPITAL_COMMUNITY)
Admission: AD | Admit: 2012-03-07 | Discharge: 2012-03-07 | Disposition: A | Discharge status: Hospice | Payer: 59 | Source: Ambulatory Visit | Attending: Obstetrics & Gynecology | Admitting: Obstetrics & Gynecology

## 2012-03-07 ENCOUNTER — Encounter (HOSPITAL_COMMUNITY): Payer: Self-pay | Admitting: *Deleted

## 2012-03-07 DIAGNOSIS — Z3490 Encounter for supervision of normal pregnancy, unspecified, unspecified trimester: Secondary | ICD-10-CM

## 2012-03-07 DIAGNOSIS — N949 Unspecified condition associated with female genital organs and menstrual cycle: Secondary | ICD-10-CM | POA: Insufficient documentation

## 2012-03-07 DIAGNOSIS — O99891 Other specified diseases and conditions complicating pregnancy: Secondary | ICD-10-CM | POA: Insufficient documentation

## 2012-03-07 LAB — URINALYSIS, ROUTINE W REFLEX MICROSCOPIC
Bilirubin Urine: NEGATIVE
Glucose, UA: NEGATIVE mg/dL
Leukocytes, UA: NEGATIVE
Nitrite: NEGATIVE
Protein, ur: NEGATIVE mg/dL
Specific Gravity, Urine: <1.005 — ABNORMAL LOW (ref 1.005–1.030)
Urobilinogen, UA: 0.2 mg/dL (ref 0.0–1.0)
pH: 6 (ref 5.0–8.0)

## 2012-03-07 LAB — URINE MICROSCOPIC-ADD ON

## 2012-03-07 NOTE — MAU Note (Signed)
Pt c/o vag pressure since 2:30 this afternoon.  No bleeding or discharge.  Pt does c/o lower abd pressure on both lower sides of abdomen.  Denies any urinary complications.

## 2012-03-07 NOTE — MAU Note (Signed)
Pt reports feeling pelvic pressure and pain that started today. Denies vaginal bleeding or discharge.

## 2012-03-07 NOTE — Discharge Instructions (Signed)
ABCs of Pregnancy A Antepartum care is very important. Be sure you see your doctor and get prenatal care as soon as you think you are pregnant. At this time, you will be tested for infection, genetic abnormalities and potential problems with you and the pregnancy. This is the time to discuss diet, exercise, work, medications, labor, pain medication during labor and the possibility of a cesarean delivery. Ask any questions that may concern you. It is important to see your doctor regularly throughout your pregnancy. Avoid exposure to toxic substances and chemicals - such as cleaning solvents, lead and mercury, some insecticides, and paint. Pregnant women should avoid exposure to paint fumes, and fumes that cause you to feel ill, dizzy or faint. When possible, it is a good idea to have a pre-pregnancy consultation with your caregiver to begin some important recommendations your caregiver suggests such as, taking folic acid, exercising, quitting smoking, avoiding alcoholic beverages, etc. B Breastfeeding is the healthiest choice for both you and your baby. It has many nutritional benefits for the baby and health benefits for the mother. It also creates a very tight and loving bond between the baby and mother. Talk to your doctor, your family and friends, and your employer about how you choose to feed your baby and how they can support you in your decision. Not all birth defects can be prevented, but a woman can take actions that may increase her chance of having a healthy baby. Many birth defects happen very early in pregnancy, sometimes before a woman even knows she is pregnant. Birth defects or abnormalities of any child in your or the father's family should be discussed with your caregiver. Get a good support bra as your breast size changes. Wear it especially when you exercise and when nursing.  C Celebrate the news of your pregnancy with the your spouse/father and family. Childbirth classes are helpful to  take for you and the spouse/father because it helps to understand what happens during the pregnancy, labor and delivery. Cesarean delivery should be discussed with your doctor so you are prepared for that possibility. The pros and cons of circumcision if it is a boy, should be discussed with your pediatrician. Cigarette smoking during pregnancy can result in low birth weight babies. It has been associated with infertility, miscarriages, tubal pregnancies, infant death (mortality) and poor health (morbidity) in childhood. Additionally, cigarette smoking may cause long-term learning disabilities. If you smoke, you should try to quit before getting pregnant and not smoke during the pregnancy. Secondary smoke may also harm a mother and her developing baby. It is a good idea to ask people to stop smoking around you during your pregnancy and after the baby is born. Extra calcium is necessary when you are pregnant and is found in your prenatal vitamin, in dairy products, green leafy vegetables and in calcium supplements. D A healthy diet according to your current weight and height, along with vitamins and mineral supplements should be discussed with your caregiver. Domestic abuse or violence should be made known to your doctor right away to get the situation corrected. Drink more water when you exercise to keep hydrated. Discomfort of your back and legs usually develops and progresses from the middle of the second trimester through to delivery of the baby. This is because of the enlarging baby and uterus, which may also affect your balance. Do not take illegal drugs. Illegal drugs can seriously harm the baby and you. Drink extra fluids (water is best) throughout pregnancy to help   your body keep up with the increases in your blood volume. Drink at least 6 to 8 glasses of water, fruit juice, or milk each day. A good way to know you are drinking enough fluid is when your urine looks almost like clear water or is very light  yellow.  E Eat healthy to get the nutrients you and your unborn baby need. Your meals should include the five basic food groups. Exercise (30 minutes of light to moderate exercise a day) is important and encouraged during pregnancy, if there are no medical problems or problems with the pregnancy. Exercise that causes discomfort or dizziness should be stopped and reported to your caregiver. Emotions during pregnancy can change from being ecstatic to depression and should be understood by you, your partner and your family. F Fetal screening with ultrasound, amniocentesis and monitoring during pregnancy and labor is common and sometimes necessary. Take 400 micrograms of folic acid daily both before, when possible, and during the first few months of pregnancy to reduce the risk of birth defects of the brain and spine. All women who could possibly become pregnant should take a vitamin with folic acid, every day. It is also important to eat a healthy diet with fortified foods (enriched grain products, including cereals, rice, breads, and pastas) and foods with natural sources of folate (orange juice, green leafy vegetables, beans, peanuts, broccoli, asparagus, peas, and lentils). The father should be involved with all aspects of the pregnancy including, the prenatal care, childbirth classes, labor, delivery, and postpartum time. Fathers may also have emotional concerns about being a father, financial needs, and raising a family. G Genetic testing should be done appropriately. It is important to know your family and the father's history. If there have been problems with pregnancies or birth defects in your family, report these to your doctor. Also, genetic counselors can talk with you about the information you might need in making decisions about having a family. You can call a major medical center in your area for help in finding a board-certified genetic counselor. Genetic testing and counseling should be done  before pregnancy when possible, especially if there is a history of problems in the mother's or father's family. Certain ethnic backgrounds are more at risk for genetic defects. H Get familiar with the hospital where you will be having your baby. Get to know how long it takes to get there, the labor and delivery area, and the hospital procedures. Be sure your medical insurance is accepted there. Get your home ready for the baby including, clothes, the baby's room (when possible), furniture and car seat. Hand washing is important throughout the day, especially after handling raw meat and poultry, changing the baby's diaper or using the bathroom. This can help prevent the spread of many bacteria and viruses that cause infection. Your hair may become dry and thinner, but will return to normal a few weeks after the baby is born. Heartburn is a common problem that can be treated by taking antacids recommended by your caregiver, eating smaller meals 5 or 6 times a day, not drinking liquids when eating, drinking between meals and raising the head of your bed 2 to 3 inches. I Insurance to cover you, the baby, doctor and hospital should be reviewed so that you will be prepared to pay any costs not covered by your insurance plan. If you do not have medical insurance, there are usually clinics and services available for you in your community. Take 30 milligrams of iron during   your pregnancy as prescribed by your doctor to reduce the risk of low red blood cells (anemia) later in pregnancy. All women of childbearing age should eat a diet rich in iron. J There should be a joint effort for the mother, father and any other children to adapt to the pregnancy financially, emotionally, and psychologically during the pregnancy. Join a support group for moms-to-be. Or, join a class on parenting or childbirth. Have the family participate when possible. K Know your limits. Let your caregiver know if you experience any of the  following:   Pain of any kind.   Strong cramps.   You develop a lot of weight in a short period of time (5 pounds in 3 to 5 days).   Vaginal bleeding, leaking of amniotic fluid.   Headache, vision problems.   Dizziness, fainting, shortness of breath.   Chest pain.   Fever of 102 F (38.9 C) or higher.   Gush of clear fluid from your vagina.   Painful urination.   Domestic violence.   Irregular heartbeat (palpitations).   Rapid beating of the heart (tachycardia).   Constant feeling sick to your stomach (nauseous) and vomiting.   Trouble walking, fluid retention (edema).   Muscle weakness.   If your baby has decreased activity.   Persistent diarrhea.   Abnormal vaginal discharge.   Uterine contractions at 20-minute intervals.   Back pain that travels down your leg.  L Learn and practice that what you eat and drink should be in moderation and healthy for you and your baby. Legal drugs such as alcohol and caffeine are important issues for pregnant women. There is no safe amount of alcohol a woman can drink while pregnant. Fetal alcohol syndrome, a disorder characterized by growth retardation, facial abnormalities, and central nervous system dysfunction, is caused by a woman's use of alcohol during pregnancy. Caffeine, found in tea, coffee, soft drinks and chocolate, should also be limited. Be sure to read labels when trying to cut down on caffeine during pregnancy. More than 200 foods, beverages, and over-the-counter medications contain caffeine and have a high salt content! There are coffees and teas that do not contain caffeine. M Medical conditions such as diabetes, epilepsy, and high blood pressure should be treated and kept under control before pregnancy when possible, but especially during pregnancy. Ask your caregiver about any medications that may need to be changed or adjusted during pregnancy. If you are currently taking any medications, ask your caregiver if it  is safe to take them while you are pregnant or before getting pregnant when possible. Also, be sure to discuss any herbs or vitamins you are taking. They are medicines, too! Discuss with your doctor all medications, prescribed and over-the-counter, that you are taking. During your prenatal visit, discuss the medications your doctor may give you during labor and delivery. N Never be afraid to ask your doctor or caregiver questions about your health, the progress of the pregnancy, family problems, stressful situations, and recommendation for a pediatrician, if you do not have one. It is better to take all precautions and discuss any questions or concerns you may have during your office visits. It is a good idea to write down your questions before you visit the doctor. O Over-the-counter cough and cold remedies may contain alcohol or other ingredients that should be avoided during pregnancy. Ask your caregiver about prescription, herbs or over-the-counter medications that you are taking or may consider taking while pregnant.  P Physical activity during pregnancy can   benefit both you and your baby by lessening discomfort and fatigue, providing a sense of well-being, and increasing the likelihood of early recovery after delivery. Light to moderate exercise during pregnancy strengthens the belly (abdominal) and back muscles. This helps improve posture. Practicing yoga, walking, swimming, and cycling on a stationary bicycle are usually safe exercises for pregnant women. Avoid scuba diving, exercise at high altitudes (over 3000 feet), skiing, horseback riding, contact sports, etc. Always check with your doctor before beginning any kind of exercise, especially during pregnancy and especially if you did not exercise before getting pregnant. Q Queasiness, stomach upset and morning sickness are common during pregnancy. Eating a couple of crackers or dry toast before getting out of bed. Foods that you normally love may  make you feel sick to your stomach. You may need to substitute other nutritious foods. Eating 5 or 6 small meals a day instead of 3 large ones may make you feel better. Do not drink with your meals, drink between meals. Questions that you have should be written down and asked during your prenatal visits. R Read about and make plans to baby-proof your home. There are important tips for making your home a safer environment for your baby. Review the tips and make your home safer for you and your baby. Read food labels regarding calories, salt and fat content in the food. S Saunas, hot tubs, and steam rooms should be avoided while you are pregnant. Excessive high heat may be harmful during your pregnancy. Your caregiver will screen and examine you for sexually transmitted diseases and genetic disorders during your prenatal visits. Learn the signs of labor. Sexual relations while pregnant is safe unless there is a medical or pregnancy problem and your caregiver advises against it. T Traveling long distances should be avoided especially in the third trimester of your pregnancy. If you do have to travel out of state, be sure to take a copy of your medical records and medical insurance plan with you. You should not travel long distances without seeing your doctor first. Most airlines will not allow you to travel after 36 weeks of pregnancy. Toxoplasmosis is an infection caused by a parasite that can seriously harm an unborn baby. Avoid eating undercooked meat and handling cat litter. Be sure to wear gloves when gardening. Tingling of the hands and fingers is not unusual and is due to fluid retention. This will go away after the baby is born. U Womb (uterus) size increases during the first trimester. Your kidneys will begin to function more efficiently. This may cause you to feel the need to urinate more often. You may also leak urine when sneezing, coughing or laughing. This is due to the growing uterus pressing  against your bladder, which lies directly in front of and slightly under the uterus during the first few months of pregnancy. If you experience burning along with frequency of urination or bloody urine, be sure to tell your doctor. The size of your uterus in the third trimester may cause a problem with your balance. It is advisable to maintain good posture and avoid wearing high heels during this time. An ultrasound of your baby may be necessary during your pregnancy and is safe for you and your baby. V Vaccinations are an important concern for pregnant women. Get needed vaccines before pregnancy. Center for Disease Control (www.cdc.gov) has clear guidelines for the use of vaccines during pregnancy. Review the list, be sure to discuss it with your doctor. Prenatal vitamins are helpful   and healthy for you and the baby. Do not take extra vitamins except what is recommended. Taking too much of certain vitamins can cause overdose problems. Continuous vomiting should be reported to your caregiver. Varicose veins may appear especially if there is a family history of varicose veins. They should subside after the delivery of the baby. Support hose helps if there is leg discomfort. W Being overweight or underweight during pregnancy may cause problems. Try to get within 15 pounds of your ideal weight before pregnancy. Remember, pregnancy is not a time to be dieting! Do not stop eating or start skipping meals as your weight increases. Both you and your baby need the calories and nutrition you receive from a healthy diet. Be sure to consult with your doctor about your diet. There is a formula and diet plan available depending on whether you are overweight or underweight. Your caregiver or nutritionist can help and advise you if necessary. X Avoid X-rays. If you must have dental work or diagnostic tests, tell your dentist or physician that you are pregnant so that extra care can be taken. X-rays should only be taken when  the risks of not taking them outweigh the risk of taking them. If needed, only the minimum amount of radiation should be used. When X-rays are necessary, protective lead shields should be used to cover areas of the body that are not being X-rayed. Y Your baby loves you. Breastfeeding your baby creates a loving and very close bond between the two of you. Give your baby a healthy environment to live in while you are pregnant. Infants and children require constant care and guidance. Their health and safety should be carefully watched at all times. After the baby is born, rest or take a nap when the baby is sleeping. Z Get your ZZZs. Be sure to get plenty of rest. Resting on your side as often as possible, especially on your left side is advised. It provides the best circulation to your baby and helps reduce swelling. Try taking a nap for 30 to 45 minutes in the afternoon when possible. After the baby is born rest or take a nap when the baby is sleeping. Try elevating your feet for that amount of time when possible. It helps the circulation in your legs and helps reduce swelling.  Most information courtesy of the CDC. Document Released: 11/24/2005 Document Revised: 11/13/2011 Document Reviewed: 08/08/2009 [employer] Patient Information 2012 [employer]. 

## 2012-03-07 NOTE — MAU Provider Note (Signed)
History     CSN: [id number]  Arrival date and time: 03/07/12 1647   First Provider Initiated Contact with Patient 03/07/12 1755      Chief Complaint  Patient presents with  . Pelvic Pain  c/o pelvic pressure since 2:30 pm HPI 26 yo [id number] WF now @ 12 2/[redacted] wk gestation presents with c/o pelvic pressure and feeling like something is falling out of vagina since 2:30 pm,. BM today nl. No vaginal bleeding. (+) very low back pain. Denies urinary sx  OB History    Grav Para Term Preterm Abortions TAB SAB Ect Mult Living   3    2  2    0      Past Medical History  Diagnosis Date  . Orthostatic hypotension   . 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 Thyroiditis     hoshimoto's; hx   . Asthma   . Depression   . Eczema   . Fever   . Weight increase   . Chills   . PCOS (polycystic ovarian syndrome)   . Sleep difficulties   . Dizziness   . Abdominal pain   . Nausea & vomiting   . Gallstones     Past Surgical History  Procedure Date  . Dilation and curettage of uterus     after miscarriage 1/11  . Appendectomy   . Knee arthroscopy 2001    right    Family History  Problem Relation Age of Onset  . Hyperlipidemia Mother   . Hyperlipidemia Father     History  Substance Use Topics  . Smoking status: Never Smoker   . Smokeless tobacco: Not on file   Comment: tobacco use - no  . Alcohol Use: 0.0 oz/week    1-2 Glasses of wine per week     rare , not since pregnancy    Allergies:  Allergies  Allergen Reactions  . Cefaclor     REACTION: CECLOR  . Cranberry Hives  . Dexilant Itching  . Macrobid   . Nitrofurantoin   . Penicillins   . Sulfonamide Derivatives     Prescriptions prior to admission  Medication Sig Dispense Refill  . albuterol (VENTOLIN HFA) 108 (90 BASE) MCG/ACT inhaler Inhale into the lungs as directed. For shortness of breath       . busPIRone (BUSPAR) 5 MG tablet Take 5 mg by mouth 2 (two) times daily as needed.  For anxiety      . Pediatric Multivit-Minerals-C (FLINTSTONES GUMMIES PO) Take by mouth daily.      . ursodiol (ACTIGALL) 250 MG tablet Take 250 mg by mouth 2 (two) times daily as needed. For gallstones        ROS neg except HPI Physical Exam   Blood pressure 116/75, pulse 85, temperature 99.3 F (37.4 C), temperature source Oral, resp. rate 18, height 5' 0.75" (1.543 m), weight 171 lb 9.6 oz (77.837 kg), last menstrual period 12/12/2011.  Physical Exam  Constitutional: She is oriented to person, place, and time. She appears well-developed and well-nourished.       obese  Genitourinary: Vagina normal.       Vulva neg Cervix closed/long/firm.  Vagina: creamy white d/c Uterus gravid nontender Bladder nontender Adnexa not palp  Neurological: She is alert and oriented to person, place, and time.  Skin: Skin is warm and dry.  Psychiatric: She has a normal mood and affect.   U/a neg. ucx sent MAU Course  Procedures  Assessment and Plan  IUP@ 12 2/7 w/o any cause for pelvic pressure noted P) keep scheduled appt Esai Stecklein A 03/07/2012, 6:06 PM

## 2012-03-08 LAB — URINE CULTURE

## 2012-03-18 ENCOUNTER — Encounter: Payer: Self-pay | Admitting: Nurse Practitioner

## 2012-03-18 ENCOUNTER — Ambulatory Visit (INDEPENDENT_AMBULATORY_CARE_PROVIDER_SITE_OTHER): Payer: 59 | Admitting: Nurse Practitioner

## 2012-03-18 VITALS — BP 100/70 | Ht 61.0 in | Wt 169.0 lb

## 2012-03-18 DIAGNOSIS — R002 Palpitations: Secondary | ICD-10-CM

## 2012-03-18 DIAGNOSIS — R55 Syncope and collapse: Secondary | ICD-10-CM

## 2012-03-18 DIAGNOSIS — R42 Dizziness and giddiness: Secondary | ICD-10-CM

## 2012-03-18 NOTE — Patient Instructions (Signed)
Your physician recommends that you schedule a follow-up appointment in: 1 month with Dr Johney Frame  Your physician has requested that you have an echocardiogram. Echocardiography is a painless test that uses sound waves to create images of your heart. It provides your doctor with information about the size and shape of your heart and how well your heart's chambers and valves are working. This procedure takes approximately one hour. There are no restrictions for this procedure.  Your physician has recommended that you wear an event monitor. Event monitors are medical devices that record the heart's electrical activity. Doctors most often [place] these monitors to diagnose arrhythmias. Arrhythmias are problems with the speed or rhythm of the heartbeat. The monitor is a small, portable device. You can wear one while you do your normal daily activities. This is usually used to diagnose what is causing palpitations/syncope (passing out). (21 DAYS)

## 2012-03-18 NOTE — Progress Notes (Signed)
Patient Name: Tracey Reyes Date of Encounter: 03/18/2012  Primary Care Provider:  Johny Blamer, MD, MD Primary Cardiologist:  Shela Commons. Allred, MD  Patient Profile  26 y/o female w/ h/o orthostatic hypotn who presents with palpitations and syncope in the setting of pregnancy.  Problem List   Past Medical History  Diagnosis Date  . Orthostatic hypotension   . Thyroiditis     a. h/o hoshimoto's  . Asthma   . Depression   . Eczema   . PCOS (polycystic ovarian syndrome)   . Dizziness   . Gallstones     a. on actigall  . Palpitations   . Syncope     a. 03/2010 Echo NL EF, Gr 1 DD;  b. 03/2012 in setting of palpitations/pregnancy   Past Surgical History  Procedure Date  . Dilation and curettage of uterus     after miscarriage 1/11  . Appendectomy   . Knee arthroscopy 2001    right    Allergies  Allergies  Allergen Reactions  . Cefaclor     REACTION: CECLOR  . Cranberry Hives  . Dexilant Itching  . Macrobid   . Nitrofurantoin   . Penicillins   . Sulfonamide Derivatives     HPI  26 year old female with the above problem list.  She was last seen 2 years ago for additional management of orthostatic hypotension.  She had an echocardiogram at that time that showed normal LV function and grade 1 diastolic dysfunction.  She was treated with Florinef which improved her symptoms however caused her to retain fluid and about a year ago she came off of it and instead increase salt in her diet.  She has been doing well over that period of time without significant symptoms of orthostasis.  Patient is currently [redacted] weeks pregnant.  Approximately 2-3 weeks ago she began to experience tachypalpitations.  On 4 occasions her palpitations were associated with profound dizziness, mild chest tightness and presyncope and on 2 of those occasions syncope resulted with her falling back onto her bed.  The symptoms are occurring while she's standing though have not occurred immediately after  standing.  They differ from her prior symptoms of orthostasis in the sense that her palpitations occur first.  She was recently seen by her obstetrician who recommended cardiology referral.  Home Medications  Prior to Admission medications   Medication Sig Start Date End Date Taking? Authorizing Provider  albuterol (VENTOLIN HFA) 108 (90 BASE) MCG/ACT inhaler Inhale into the lungs as directed. For shortness of breath    Yes Historical Provider, MD  busPIRone (BUSPAR) 5 MG tablet Take 5 mg by mouth 2 (two) times daily as needed. For anxiety 03/04/12  Yes Historical Provider, MD  Prenatal Vit-Fe Fumarate-FA (PRENATAL MULTIVITAMIN) TABS Take 1 tablet by mouth daily.   Yes Historical Provider, MD  ursodiol (ACTIGALL) 250 MG tablet Take 250 mg by mouth 2 (two) times daily as needed. For gallstones   Yes Historical Provider, MD    Review of Systems She has had palpitations, presyncope, and syncope as outlined in the history of present illness.  She has also had mild lower extremity edema which he attributes to pregnancy.  No chest pain, sob, n, v, early satiety, dysuria, dark stools, blood in stools, diarrhea, rash/skin changes, fevers, chills, wt loss/gain.  Otherwise all systems reviewed and negative.  Physical Exam  Blood pressure 100/70, height 5\' 1"  (1.549 m), weight 169 lb (76.658 kg), last menstrual period 12/12/2011.  General: Pleasant, NAD Psych:  Normal affect. Neuro: Alert and oriented X 3. Moves all extremities spontaneously. HEENT: Normal  Neck: Supple without bruits or JVD. Lungs:  Resp regular and unlabored, CTA. Heart: RRR no s3, s4, or murmurs. Abdomen: Soft, non-tender, non-distended, BS + x 4.  Extremities: No clubbing, cyanosis.  Trace bilateral lower extremity edema.  DP/PT/Radials 2+ and equal bilaterally.  Accessory Clinical Findings  ECG - regular sinus rhythm, 77, nonspecific T wave abnormality without any acute changes.  Assessment & Plan  1.  Tachypalpitations:   Patient presents with a several week history of palpitations associated with presyncope and syncope.  Her ECG is unchanged from previous tracing.  She reports having recent blood work with her obstetrician including a TSH which he says was normal.  We will obtain a 21 day event monitor as well as a 2-D echocardiogram and have her followup with Dr. Johney Frame in about 4 weeks.  2.  Syncope:  See above.  Obtaining 2-D echocardiogram.  She reports a family history of ventricular tachycardia with an uncle being diagnosed with VT at age 32she is a normal QT interval and ECG is not acutely changed.  Followup echo and monitor as above.  3.  Orthostatic hypotension:  This is been less of an issue as she knows what precautions to take to prevent her from having symptoms.  Her blood pressure does run low and we'll need to keep this in mind in case she requires beta blockade secondary to palpitations.  She did instruct her to wear TED hose especially in the setting of fluid retention with pregnancy and lower extremity edema.  4.  Disposition:  Testing as above, followup with Dr. Johney Frame in 4 weeks.  Nicolasa Ducking, NP 03/18/2012, 1:38 PM

## 2012-03-26 ENCOUNTER — Encounter (INDEPENDENT_AMBULATORY_CARE_PROVIDER_SITE_OTHER): Payer: 59

## 2012-03-26 DIAGNOSIS — R002 Palpitations: Secondary | ICD-10-CM

## 2012-03-26 DIAGNOSIS — R55 Syncope and collapse: Secondary | ICD-10-CM

## 2012-04-02 ENCOUNTER — Ambulatory Visit (HOSPITAL_COMMUNITY): Payer: 59 | Attending: Internal Medicine

## 2012-04-02 ENCOUNTER — Other Ambulatory Visit: Payer: Self-pay

## 2012-04-02 DIAGNOSIS — R55 Syncope and collapse: Secondary | ICD-10-CM | POA: Insufficient documentation

## 2012-04-02 DIAGNOSIS — R002 Palpitations: Secondary | ICD-10-CM | POA: Insufficient documentation

## 2012-04-02 DIAGNOSIS — O99891 Other specified diseases and conditions complicating pregnancy: Secondary | ICD-10-CM | POA: Insufficient documentation

## 2012-04-23 ENCOUNTER — Encounter: Payer: Self-pay | Admitting: Physician Assistant

## 2012-04-23 ENCOUNTER — Ambulatory Visit (INDEPENDENT_AMBULATORY_CARE_PROVIDER_SITE_OTHER): Payer: Self-pay | Admitting: Physician Assistant

## 2012-04-23 VITALS — BP 96/69 | HR 101 | Ht 61.0 in | Wt 172.0 lb

## 2012-04-23 DIAGNOSIS — I951 Orthostatic hypotension: Secondary | ICD-10-CM

## 2012-04-23 DIAGNOSIS — R55 Syncope and collapse: Secondary | ICD-10-CM

## 2012-04-23 DIAGNOSIS — R002 Palpitations: Secondary | ICD-10-CM

## 2012-04-23 NOTE — Patient Instructions (Signed)
PLEASE INCREASE FLUIDS AND SALT INTAKE;   MAKE SURE YOUR URINE ALWAYS CLEAR, IF NOT THEN INCREASE FLUIDS (WATER)  YOU MAY LOOK INTO GETTING SALT TABLETS  COMPRESSION STOCKINGS THIGH HIGH, YOU HAVE BEEN GIVEN A FORM TO PICK THESE UP IF YOU DO NOT ALREADY HAVE  PLEASE MAKE AN APPOINTMENT TO SEE DR. ALLRED

## 2012-04-23 NOTE — Progress Notes (Signed)
[address] [place], [place]  [address] Phone: [phone] Fax:  [phone]  Date:  04/23/2012   Name:  Tracey Reyes   DOB:  Apr 13, 1986   MRN:  [id number]  PCP:  Johny Blamer, MD, MD  Primary Cardiologist/Primary Electrophysiologist:  Dr. Hillis Range    History of Present Illness: Tracey Reyes is a 26 y.o. female who returns for follow up.  She is [redacted] weeks pregnant.  She has a h/o orthostatic hypotn, PCOS, Hashimoto's Thyroiditis, depression, asthma.  She was previously treated with Florinef for orthostasis with improvement.  This caused fluid retention and the medication was stopped in exchange for increase salt in her diet with maintained stability.  She saw Ward Givens, NP 03/18/12 for evaluation of palps, CP and syncope.  This was preceded by near-syncope.  Palpitations seem to occur first prior to other symptoms.  Her Ob recommended cardiac follow up.  TSH was reportedly normal.  Echo 04/02/12: EF 60-65%.  21 day event monitor was obtained.  Strips that are currently available demonstrate NSR and sinus tachy with peak HR 109.  Today she notes that she has had no further syncopal episodes.  She typically gets near syncopal after prolonged standing. No shower intol.  She does note worsening symptoms with increased heat.  She typically feels tachy-palps followed by chest tightness and near syncope.  Sitting down results in resolution.  She denies exertional chest pain or DOE.  Pregnancy is going well.  No complications.     Wt Readings from Last 3 Encounters:  04/23/12 172 lb (78.019 kg)  03/18/12 169 lb (76.658 kg)  03/07/12 171 lb 9.6 oz (77.837 kg)     Potassium  Date/Time Value Range Status  10/21/2010  1:36 AM 3.6  3.5-5.1 (mEq/L) Final     Creatinine, Ser  Date/Time Value Range Status  10/21/2010  1:36 AM .9  0.4-1.2 (mg/dL) Final     ALT  Date/Time Value Range Status  10/21/2010  1:36 AM 21  0-35 (U/L) Final     Hemoglobin    Date/Time Value Range Status  10/21/2010  1:36 AM 14.7  12.0-15.0 (g/dL) Final    Past Medical History  Diagnosis Date  . Orthostatic hypotension   . Thyroiditis     a. h/o hoshimoto's  . Asthma   . Depression   . Eczema   . PCOS (polycystic ovarian syndrome)   . Dizziness   . Gallstones     a. on actigall  . Palpitations   . Syncope     a. 03/2010 Echo NL EF, Gr 1 DD;  b. 03/2012 in setting of palpitations/pregnancy;  c. echo 04/02/12: EF 60-65%     Current Outpatient Prescriptions  Medication Sig Dispense Refill  . albuterol (VENTOLIN HFA) 108 (90 BASE) MCG/ACT inhaler Inhale into the lungs as directed. For shortness of breath       . busPIRone (BUSPAR) 5 MG tablet Take 5 mg by mouth 2 (two) times daily as needed. For anxiety      . Prenatal Vit-Fe Fumarate-FA (PRENATAL MULTIVITAMIN) TABS Take 1 tablet by mouth daily.      . ursodiol (ACTIGALL) 250 MG tablet Take 250 mg by mouth 2 (two) times daily as needed. For gallstones        Allergies: Allergies  Allergen Reactions  . Cefaclor     REACTION: CECLOR  . Cranberry Hives  . Dexlansoprazole Itching  . Nitrofurantoin   . Nitrofurantoin Monohyd Macro   .  Penicillins   . Sulfonamide Derivatives     History  Substance Use Topics  . Smoking status: Never Smoker   . Smokeless tobacco: Not on file   Comment: tobacco use - no  . Alcohol Use: 0.0 oz/week    1-2 Glasses of wine per week     rare , not since pregnancy     ROS:  Please see the history of present illness.    All other systems reviewed and negative.   PHYSICAL EXAM: VS:  BP 90/70  Pulse 82  Ht 5\' 1"  (1.549 m)  Wt 172 lb (78.019 kg)  BMI 32.50 kg/m2  LMP 12/12/2011 Well nourished, well developed, in no acute distress HEENT: normal Neck: no JVD Cardiac:  normal S1, S2; RRR; no murmur Lungs:  clear to auscultation bilaterally, no wheezing, rhonchi or rales Abd: soft, nontender, no hepatomegaly Ext: no edema Skin: warm and dry Neuro:  CNs 2-12  intact, no focal abnormalities noted  EKG:  NSR, HR 82, low voltage, no acute changes, no change from prior   ASSESSMENT AND PLAN:  1.  Syncope    -  I suspect her volume shifts with pregnancy have exacerbated orthostatic hypotension.     -  Echo is normal   -  Monitor without significant arrhythmia   -  Recommend pushing fluids, salt and wearing compression stockings   -  Would not recommend beta blocker rx with low BP   -  Should not use florinef with pregnancy    -  Follow up with Dr. Hillis Range in 3 mos   Signed, Tereso Newcomer, PA-C  11:33 AM 04/23/2012

## 2012-07-23 ENCOUNTER — Encounter (HOSPITAL_COMMUNITY): Payer: Self-pay | Admitting: *Deleted

## 2012-07-23 ENCOUNTER — Inpatient Hospital Stay (HOSPITAL_COMMUNITY)
Admission: AD | Admit: 2012-07-23 | Discharge: 2012-07-23 | Disposition: A | Discharge status: Home / Self Care | Payer: BC Managed Care – PPO | Source: Ambulatory Visit | Attending: Obstetrics & Gynecology | Admitting: Obstetrics & Gynecology

## 2012-07-23 DIAGNOSIS — O99891 Other specified diseases and conditions complicating pregnancy: Secondary | ICD-10-CM | POA: Insufficient documentation

## 2012-07-23 DIAGNOSIS — W1781XA Fall down embankment (hill), initial encounter: Secondary | ICD-10-CM | POA: Diagnosis present

## 2012-07-23 DIAGNOSIS — W1809XA Striking against other object with subsequent fall, initial encounter: Secondary | ICD-10-CM | POA: Insufficient documentation

## 2012-07-23 DIAGNOSIS — R1032 Left lower quadrant pain: Secondary | ICD-10-CM | POA: Insufficient documentation

## 2012-07-23 NOTE — MAU Note (Signed)
Patient states she fell and hit her abdomen on her legs at about 1230 and started cramping about 10 minutes later. Denies any leaking or bleeding. States she is feeling fetal movement but less than usual and movement is painful.

## 2012-07-23 NOTE — MAU Provider Note (Signed)
History     CSN: [id number]  Arrival date and time: 07/23/12 1542   None    Chief Complaint  Patient presents with  . Fall  . Abdominal Pain   HPI 32 wk pregnancy, was leaving work and fell while trying to get inside her SUV, twisted her ankle as she fell after her leg gave out and noted her thighs were up against her abdomen "crushing her abdomen" in jack-knife position while her buttocks hit the ground.  Has been feeling fetal movements since but also noted some pain with fetal movements in LLQ/round ligament area. Denies strong regular contractions, leaking fluid or vaginal bleeding. Denies any other injury.   Past Medical History  Diagnosis Date  . Orthostatic hypotension   . Thyroiditis     a. h/o hoshimoto's  . Asthma   . Depression   . Eczema   . PCOS (polycystic ovarian syndrome)   . Dizziness   . Gallstones     a. on actigall  . Palpitations   . Syncope     a. 03/2010 Echo NL EF, Gr 1 DD;  b. 03/2012 in setting of palpitations/pregnancy;  c. echo 04/02/12: EF 60-65%    Past Surgical History  Procedure Date  . Dilation and curettage of uterus     after miscarriage 1/11  . Appendectomy   . Knee arthroscopy 2001    right   Family History  Problem Relation Age of Onset  . Hyperlipidemia Mother   . Hyperlipidemia Father    History  Substance Use Topics  . Smoking status: Never Smoker   . Smokeless tobacco: Not on file   Comment: tobacco use - no  . Alcohol Use: 0.0 oz/week    1-2 Glasses of wine per week     rare , not since pregnancy   Allergies:  Allergies  Allergen Reactions  . Cefaclor     REACTION: CECLOR  . Cranberry Hives  . Dexlansoprazole Itching  . Nitrofurantoin   . Nitrofurantoin Monohyd Macro   . Penicillins   . Sulfonamide Derivatives    Prescriptions prior to admission  Medication Sig Dispense Refill  . cyclobenzaprine (FLEXERIL) 5 MG tablet Take 5 mg by mouth as needed. For help with walking      . lidocaine (LIDODERM) 5 % Place  1 patch onto the skin daily. Remove & Discard patch within 12 hours or as directed by MD      . Prenatal Vit-Fe Fumarate-FA (PRENATAL MULTIVITAMIN) TABS Take 1 tablet by mouth daily.      . ursodiol (ACTIGALL) 250 MG tablet Take 250 mg by mouth 2 (two) times daily as needed. For gallstones      . albuterol (VENTOLIN HFA) 108 (90 BASE) MCG/ACT inhaler Inhale into the lungs as directed. For shortness of breath       . busPIRone (BUSPAR) 5 MG tablet Take 5 mg by mouth 2 (two) times daily as needed. For anxiety       ROS Physical Exam   Blood pressure 106/69, pulse 93, temperature 98.1 F (36.7 C), temperature source Oral, resp. rate 16, height 5' 0.5" (1.537 m), weight 185 lb 3.2 oz (84.006 kg), last menstrual period 12/12/2011, SpO2 100.00%. Physical Exam A&O x 3, no acute distress. Pleasant HEENT neg Lungs CTA bilat CV RRR, S1S2 normal Abdo soft, non tender, non acute, relaxed gravid uterus without bruising and tenderness Extr no edema/ tenderness Pelvic deferred FHT  140s/ minimal variability/+ accels/ no decels- reactive- category I Toco none  seen or palpated.  MAU Course  Procedures 4 hrs of FHT monitoring, reactive/ category I. No uterine contractions noted.   Assessment and Plan  32 wk pregnancy with fall while trying to climb inside her car with minor fall and no abdominal injury. Continue to monitor for 4 hrs and if no regular contractions/bleeding and if FHT category I, will discharge home with abruption precautions.  FHT x 4 hrs- no decels. No contractions- reactive/ category I. Pt without pain. Discharged home, f/up Dr Billy Coast next wk. PTL precautions, abruption precautions reviewed, agrees.   Chera Slivka R 07/23/2012, 5:10 PM

## 2012-08-04 ENCOUNTER — Emergency Department (HOSPITAL_COMMUNITY)
Admission: EM | Admit: 2012-08-04 | Discharge: 2012-08-05 | Disposition: A | Discharge status: Home / Self Care | Payer: BC Managed Care – PPO | Source: Home / Self Care | Attending: Emergency Medicine | Admitting: Emergency Medicine

## 2012-08-04 DIAGNOSIS — R1031 Right lower quadrant pain: Secondary | ICD-10-CM | POA: Diagnosis present

## 2012-08-04 DIAGNOSIS — K8020 Calculus of gallbladder without cholecystitis without obstruction: Secondary | ICD-10-CM

## 2012-08-04 DIAGNOSIS — O9989 Other specified diseases and conditions complicating pregnancy, childbirth and the puerperium: Principal | ICD-10-CM | POA: Diagnosis present

## 2012-08-04 NOTE — ED Notes (Signed)
Patient states that she is [redacted] weeks pregnant. OB Dr. Billy Coast told patient to come here to be evaluated for gallbladder attack. Patient started having RUQ pain around 3pm. States ultrasound earlier this year showed 4 9mm stones. Denies contractions, leaking of fluid or vaginal bleeding.

## 2012-08-05 ENCOUNTER — Emergency Department (HOSPITAL_COMMUNITY): Payer: BC Managed Care – PPO

## 2012-08-05 ENCOUNTER — Inpatient Hospital Stay (HOSPITAL_COMMUNITY)
Admission: AD | Admit: 2012-08-05 | Discharge: 2012-08-05 | DRG: 886 | Disposition: A | Discharge status: Home / Self Care | Payer: BC Managed Care – PPO | Source: Ambulatory Visit | Attending: Obstetrics and Gynecology | Admitting: Obstetrics and Gynecology

## 2012-08-05 ENCOUNTER — Encounter (HOSPITAL_COMMUNITY): Payer: Self-pay | Admitting: Family Medicine

## 2012-08-05 DIAGNOSIS — K829 Disease of gallbladder, unspecified: Secondary | ICD-10-CM | POA: Diagnosis present

## 2012-08-05 DIAGNOSIS — Z331 Pregnant state, incidental: Secondary | ICD-10-CM

## 2012-08-05 DIAGNOSIS — R109 Unspecified abdominal pain: Secondary | ICD-10-CM

## 2012-08-05 LAB — CBC WITH DIFFERENTIAL/PLATELET
HCT: 33.2 % — ABNORMAL LOW (ref 36.0–46.0)
Hemoglobin: 11.2 g/dL — ABNORMAL LOW (ref 12.0–15.0)
Lymphocytes Relative: 21 % (ref 12–46)
MCHC: 33.7 g/dL (ref 30.0–36.0)
Monocytes Absolute: 0.5 10*3/uL (ref 0.1–1.0)
Monocytes Relative: 7 % (ref 3–12)
Neutro Abs: 6 10*3/uL (ref 1.7–7.7)
WBC: 8.4 10*3/uL (ref 4.0–10.5)

## 2012-08-05 LAB — LIPASE, BLOOD: Lipase: 20 U/L (ref 11–59)

## 2012-08-05 LAB — TYPE AND SCREEN
ABO/RH(D): B NEG
Antibody Screen: POSITIVE
DAT, IgG: NEGATIVE

## 2012-08-05 LAB — COMPREHENSIVE METABOLIC PANEL
BUN: 4 mg/dL — ABNORMAL LOW (ref 6–23)
CO2: 19 mEq/L (ref 19–32)
Chloride: 105 mEq/L (ref 96–112)
Creatinine, Ser: 0.45 mg/dL — ABNORMAL LOW (ref 0.50–1.10)
GFR calc non Af Amer: >90 mL/min (ref >90)
Glucose, Bld: 92 mg/dL (ref 70–99)
Total Bilirubin: 0.2 mg/dL — ABNORMAL LOW (ref 0.3–1.2)

## 2012-08-05 MED ORDER — ONDANSETRON HCL 4 MG/2ML IJ SOLN
4.0000 mg | Freq: Once | INTRAMUSCULAR | Status: DC
  Filled 2012-08-05: qty 2

## 2012-08-05 MED ORDER — OXYCODONE-ACETAMINOPHEN 5-325 MG PO TABS: 1.0000 | ORAL_TABLET | ORAL | Status: AC | PRN

## 2012-08-05 MED ORDER — ONDANSETRON HCL 4 MG/2ML IJ SOLN
4.0000 mg | Freq: Four times a day (QID) | INTRAMUSCULAR | Status: DC | PRN
  Administered 2012-08-05: 4 mg via INTRAVENOUS
  Filled 2012-08-05: qty 2

## 2012-08-05 MED ORDER — BUTORPHANOL TARTRATE 1 MG/ML IJ SOLN
1.0000 mg | INTRAMUSCULAR | Status: DC | PRN
  Administered 2012-08-05 (×2): 1 mg via INTRAVENOUS
  Filled 2012-08-05 (×2): qty 1

## 2012-08-05 MED ORDER — ZOLPIDEM TARTRATE 5 MG PO TABS: 5.0000 mg | ORAL_TABLET | Freq: Every evening | ORAL | Status: DC | PRN

## 2012-08-05 MED ORDER — SODIUM CHLORIDE 0.9 % IV SOLN
1.0000 g | Freq: Once | INTRAVENOUS | Status: AC
  Administered 2012-08-05: 1 g via INTRAVENOUS
  Filled 2012-08-05: qty 1

## 2012-08-05 MED ORDER — ACETAMINOPHEN 325 MG PO TABS: 650.0000 mg | ORAL_TABLET | ORAL | Status: DC | PRN

## 2012-08-05 MED ORDER — CALCIUM CARBONATE ANTACID 500 MG PO CHEW: 2.0000 | CHEWABLE_TABLET | ORAL | Status: DC | PRN

## 2012-08-05 MED ORDER — DEXTROSE IN LACTATED RINGERS 5 % IV SOLN
INTRAVENOUS | Status: DC
  Administered 2012-08-05: 09:00:00 via INTRAVENOUS

## 2012-08-05 MED ORDER — OXYCODONE-ACETAMINOPHEN 5-325 MG PO TABS
2.0000 | ORAL_TABLET | ORAL | Status: DC | PRN
  Administered 2012-08-05: 2 via ORAL
  Filled 2012-08-05: qty 2

## 2012-08-05 MED ORDER — PRENATAL MULTIVITAMIN CH: 1.0000 | ORAL_TABLET | Freq: Every day | ORAL | Status: DC

## 2012-08-05 MED ORDER — SODIUM CHLORIDE 0.9 % IV BOLUS (SEPSIS)
1000.0000 mL | Freq: Once | INTRAVENOUS | Status: AC
  Administered 2012-08-05: 1000 mL via INTRAVENOUS

## 2012-08-05 MED ORDER — DOCUSATE SODIUM 100 MG PO CAPS: 100.0000 mg | ORAL_CAPSULE | Freq: Every day | ORAL | Status: DC

## 2012-08-05 NOTE — Consult Note (Signed)
Called by Dr. Billy Coast concerning this patient.  Woman now [redacted] weeks pregnant with what sounds like a gallbladder attack.  Concern of infection.  She was admitted to [facility].  Patient is feeling better now.  Not totally pain-free.  No fever or white count.  Dr Billy Coast wonders if this can be transitioned to outpatient followup.  We discussed options.  Unfortunately, she has a lot of antibiotic allergies.  Patient has been seen by Dr. Ezzard Standing in our group August 2012.  She did not want to have surgery last year.  Then became pregnant and wished to wait on revisit Feb2013..  If the patient has no pain and is eating well, it was probably just an attack, and she can followup with Dr. Ezzard Standing with [facility] group after delivery of her pregnancy.  If she has worsening pain or discomfort, it may come to surgery.  Normal Unasyn would be a good choice of antibiotic and a patent.  She has numerous allergies.  Fluoroquinolones not a good option during pregnancy.  I thought a dose of Invanz may be helpful  At this point Dr. Billy Coast did not feel like general surgery needed to see her formally.  If she does not improve, he will call us back.

## 2012-08-05 NOTE — ED Notes (Signed)
Rapid Response OB nurse Victorino Dike notified of patient's arrival and will come to [facility] ED to place patient on monitor.

## 2012-08-05 NOTE — Progress Notes (Signed)
Patient ID: Tracey Reyes, female   DOB: 1986/10/25, 26 y.o.   MRN: [id number] 26 yo wf at 60 6/[redacted] weeks gestation presented to [facility] ER with RUQ pain last night. Nl CBC and CMP. Consult with [facility] recommended IVF hydration and IV abx. Pain improved after Stadol.  BP 87/58  Pulse 84  Temp 98.1 F (36.7 C) (Oral)  Resp 20  Ht 5' (1.524 m)  Wt 85.276 kg (188 lb)  BMI 36.72 kg/m2  LMP 12/12/2011   See dictated note for PE CBC    Component Value Date/Time   WBC 8.4 08/05/2012 0325   RBC 3.92 08/05/2012 0325   HGB 11.2* 08/05/2012 0325   HCT 33.2* 08/05/2012 0325   PLT 142* 08/05/2012 0325   MCV 84.7 08/05/2012 0325   MCH 28.6 08/05/2012 0325   MCHC 33.7 08/05/2012 0325   RDW 13.5 08/05/2012 0325   LYMPHSABS 1.7 08/05/2012 0325   MONOABS 0.5 08/05/2012 0325   EOSABS 0.1 08/05/2012 0325   BASOSABS 0.0 08/05/2012 0325     FHR reactive No contractions Category 1 tracing  Imp/Plan:: 33+ weeks Biliary colic - no evidence of acute cholecystitis IVF and  IV analgesia DC home today.

## 2012-08-05 NOTE — ED Notes (Signed)
Lab at bedside

## 2012-08-05 NOTE — Progress Notes (Signed)
Here to do NST, sent by [facility] for evaluation for gall bladder

## 2012-08-05 NOTE — ED Provider Notes (Signed)
History     CSN: [id number]  Arrival date & time 08/04/12  2348   First MD Initiated Contact with Patient 08/05/12 0111      Chief Complaint  Patient presents with  . Abdominal Pain    (Consider location/radiation/quality/duration/timing/severity/associated sxs/prior treatment) HPI Comments: 26 year old female with a history of gallstones in the past who is also [redacted] weeks pregnant presents with a complaint of right upper quadrant pain. She states this started approximately 3:00 PM and has been persistent throughout the evening. Nothing makes the pain worse however it does get better when she holds pressure on her right upper quadrant. There has been one episode of watery green stool but no vomiting. She is nauseated but has no fevers or chills and has no back pain. This is not the same as the symptoms she has had with her biliary colic in the past. She has arty had an appendicitis with an appendectomy in the past and has had 2 miscarriages. This is her third pregnancy at 34 weeks.  Patient is a 26 y.o. female presenting with abdominal pain. The history is provided by the patient.  Abdominal Pain The primary symptoms of the illness include abdominal pain.    Past Medical History  Diagnosis Date  . Orthostatic hypotension   . Thyroiditis     a. h/o hoshimoto's  . Asthma   . Depression   . Eczema   . PCOS (polycystic ovarian syndrome)   . Dizziness   . Gallstones     a. on actigall  . Palpitations   . Syncope     a. 03/2010 Echo NL EF, Gr 1 DD;  b. 03/2012 in setting of palpitations/pregnancy;  c. echo 04/02/12: EF 60-65%     Past Surgical History  Procedure Date  . Dilation and curettage of uterus     after miscarriage 1/11  . Appendectomy   . Knee arthroscopy 2001    right    Family History  Problem Relation Age of Onset  . Hyperlipidemia Mother   . Hyperlipidemia Father     History  Substance Use Topics  . Smoking status: Never Smoker   . Smokeless tobacco: Not on  file   Comment: tobacco use - no  . Alcohol Use: 0.0 oz/week    1-2 Glasses of wine per week     rare , not since pregnancy    OB History    Grav Para Term Preterm Abortions TAB SAB Ect Mult Living   3    2  2    0      Review of Systems  Gastrointestinal: Positive for abdominal pain.  All other systems reviewed and are negative.    Allergies  Cefaclor; Cranberry; Dexlansoprazole; Nitrofurantoin; Nitrofurantoin monohyd macro; Penicillins; and Sulfonamide derivatives  Home Medications   Current Outpatient Rx  Name Route Sig Dispense Refill  . ACETAMINOPHEN 325 MG PO TABS Oral Take 325 mg by mouth every 6 (six) hours as needed.    . ALBUTEROL SULFATE HFA 108 (90 BASE) MCG/ACT IN AERS Inhalation Inhale 1 puff into the lungs as directed. For shortness of breath     . CYCLOBENZAPRINE HCL 5 MG PO TABS Oral Take 5 mg by mouth as needed. For help with walking    . LIDOCAINE 5 % EX PTCH Transdermal Place 1 patch onto the skin daily. Remove & Discard patch within 12 hours or as directed by MD    . PRENATAL MULTIVITAMIN CH Oral Take 1 tablet by mouth daily.    Marland Kitchen  URSODIOL 250 MG PO TABS Oral Take 250 mg by mouth 2 (two) times daily as needed. For gallstones      BP 104/53  Pulse 95  Temp 98.6 F (37 C) (Oral)  Resp 20  Wt 188 lb 8 oz (85.503 kg)  SpO2 100%  LMP 12/12/2011  Physical Exam  Nursing note and vitals reviewed. Constitutional: She appears well-developed and well-nourished. No distress.  HENT:  Head: Normocephalic and atraumatic.  Mouth/Throat: Oropharynx is clear and moist. No oropharyngeal exudate.  Eyes: Conjunctivae and EOM are normal. Pupils are equal, round, and reactive to light. Right eye exhibits no discharge. Left eye exhibits no discharge. No scleral icterus.  Neck: Normal range of motion. Neck supple. No JVD present. No thyromegaly present.  Cardiovascular: Normal rate, regular rhythm, normal heart sounds and intact distal pulses.  Exam reveals no gallop  and no friction rub.   No murmur heard. Pulmonary/Chest: Effort normal and breath sounds normal. No respiratory distress. She has no wheezes. She has no rales.  Abdominal: Soft. Bowel sounds are normal. She exhibits no distension and no mass. There is tenderness ( Gravid abdomen with mild epigastric tenderness, right upper quadrant pain improves with palpation, no other abdominal tenderness, good fetal movements).  Musculoskeletal: Normal range of motion. She exhibits no edema and no tenderness.  Lymphadenopathy:    She has no cervical adenopathy.  Neurological: She is alert. Coordination normal.  Skin: Skin is warm and dry. No rash noted. No erythema.  Psychiatric: She has a normal mood and affect. Her behavior is normal.    ED Course  Procedures (including critical care time)  Labs Reviewed  CBC WITH DIFFERENTIAL - Abnormal; Notable for the following:    Hemoglobin 11.2 (*)     HCT 33.2 (*)     Platelets 142 (*)     All other components within normal limits  COMPREHENSIVE METABOLIC PANEL - Abnormal; Notable for the following:    BUN 4 (*)     Creatinine, Ser 0.45 (*)     Albumin 2.6 (*)     Total Bilirubin 0.2 (*)     All other components within normal limits  LIPASE, BLOOD   US Abdomen Complete  08/05/2012  *RADIOLOGY REPORT*  Clinical Data:  Right upper quadrant abdominal pain.  COMPLETE ABDOMINAL ULTRASOUND  Comparison:  12/22/2028 ultrasound  Findings:  Gallbladder:  Cholelithiasis.  No gallbladder wall thickening. Per the sonographer, positive sonographic Murphy's sign.  Common bile duct:  Measures 5 mm proximally and 3 mm near the pancreatic portion.  Liver:  No focal lesion identified.  Within normal limits in parenchymal echogenicity.  IVC:  Appears normal.  Pancreas:  Poorly visualized due to overlying bowel gas artifact.  Spleen:  Measures up to 12.8 cm, upper normal to mildly enlarged.  Right Kidney:  There is mild hydronephrosis, nonspecific in the setting of pregnancy.   Measures 13 cm.  Normal echogenicity.  Left Kidney:  Measures 11.5 cm.  No hydronephrosis.  Abdominal aorta:  No aneurysm identified.  The distal portion is obscured.  IMPRESSION: Cholelithiasis.  No wall thickening or pericholecystic fluid. However, per the sonographer, positive sonographic Murphy's sign which raises concern for acute cholecystitis.  Spleen upper normal to mildly enlarged.  Mild hydronephrosis on the right is nonspecific in the setting of pregnancy.   Original Report Authenticated By: Waneta Martins, M.D.      1. Symptomatic cholelithiasis       MDM  The patient is well appearing with  normal vital signs for pregnancy, tenderness is in the upper abdomen, with considerable colitis and cholecystitis and pancreatitis is potential sources. Labs pending, fluids, ultrasound.   Labs are overall unremarkable, ultrasound of the abdomen shows a sonographic Murphy sign however she has no other objective evidence of acute cholecystitis. I would consider that she does have symptomatic cholelithiasis and possibly cholecystitis. I have discussed her care with the general surgeon who agrees that the best plan at this time is observation with antibiotics. I have discussed this with her OB/GYN who also agrees to accept the patient to [facility] hospital for observation we'll admission and antibiotics with repeat abdominal exams. The patient is in agreement with this plan and requests to go by private transport.  Prior to discharge I have repeated the patient's abdominal exam and she has ongoing tenderness in the right upper quadrant but is non-peritoneal.    Vida Roller, MD 08/05/12 [phone]

## 2012-08-06 ENCOUNTER — Ambulatory Visit: Payer: BC Managed Care – PPO | Admitting: Internal Medicine

## 2012-08-06 NOTE — H&P (Signed)
Tracey Reyes, Tracey Reyes             ACCOUNT NO.:  [id number]  MEDICAL RECORD NO.:  [id number]  LOCATION:  9174                          FACILITY:  [facility]  PHYSICIAN:  Lenoard Aden, M.D.DATE OF BIRTH:  10-12-1986  DATE OF ADMISSION:  08/05/2012 DATE OF DISCHARGE:  08/05/2012                             HISTORY & PHYSICAL   CHIEF COMPLAINT:  Right lower quadrant pain.  HISTORY OF PRESENT ILLNESS:  She is a 26 year old white female G3, P0, at 49 and 6/7th weeks who presented at [facility] Emergency Room last evening with a complaint of right upper quadrant pain, the emergency room physician who has evaluated the patient consulted with General Surgery Dr. Donell Beers who recommended inpatient admission for IV antibiotics and IV fluid hydration.  The patient however had a white blood cell count of 8000 and has multiple antibiotic allergies and presents now to [facility] with an improvement in her pain, good fetal movement.  Denies contractions, bleeding, or leakage of fluid.  ALLERGIES:  DEXILANT, MACROBID, CECLOR, CRANBERRIES, SULFA DRUGS, and PENICILLIN.  SOCIAL HISTORY:  She has a personal history of gallstones as noted. Hashimoto disease, PCOS, kidney problems, and asthma in addition to an abnormal Pap smear.  MEDICATIONS:  Include prenatal vitamins, BuSpar p.r.n., Flexeril p.r.n., Actigall p.r.n., and Lidoderm patch for low back discomfort as needed.  FAMILY HISTORY:  She has a family history of depression, hypertension, heart disease, and diabetes.  She has a history of SAB x2.  SURGICAL HISTORY:  Remarkable for knee surgery, D & C, and appendectomy, prenatal course to date has been uncomplicated.  PHYSICAL EXAMINATION:  GENERAL:  The patient is a well-developed, well- nourished, white female, in no acute distress. VITAL SIGNS:  Stable.  The patient is afebrile. HEENT:  Normal. NECK:  Supple.  Full range of motion. LUNGS:  Clear. HEART:  Regular rhythm. ABDOMEN:  Soft,  gravid.  There is tenderness to palpation along the right costal margin with a positive Murphy  sign.  The fundus is nontender.  No CVA tenderness. EXTREMITIES:  No cords. NEUROLOGIC:  Nonfocal. SKIN:  Intact. PELVIC:  Deferred.  NST is reactive.  Fetal heart rate tracing 141/50 with good beat-to-beat variability category 1 tracing.  Good accelerations and no decelerations noted.  No contractions noted.  IMPRESSION: 1. A 33 and 6/7th week intrauterine pregnancy. 2. Biliary colic with no evidence of acute cholecystitis.  PLAN:  We will defer IV antibiotics for now, IV fluid hydration, and IV analgesia given. Probable discharge home later today.  Pending response, pain medication, follow up in the office scheduled.  Followup in General Surgery scheduled.     Lenoard Aden, M.D.     RJT/MEDQ  D:  08/05/2012  T:  08/06/2012  Job:  [id number]

## 2012-08-13 LAB — OB RESULTS CONSOLE GBS: GBS: NEGATIVE

## 2012-08-19 ENCOUNTER — Other Ambulatory Visit: Payer: Self-pay | Admitting: Obstetrics and Gynecology

## 2012-08-19 ENCOUNTER — Encounter (HOSPITAL_COMMUNITY): Payer: Self-pay | Admitting: *Deleted

## 2012-08-19 ENCOUNTER — Telehealth (HOSPITAL_COMMUNITY): Payer: Self-pay | Admitting: *Deleted

## 2012-08-19 NOTE — Telephone Encounter (Signed)
Preadmission screen  

## 2012-08-29 ENCOUNTER — Inpatient Hospital Stay (HOSPITAL_COMMUNITY)
Admission: RE | Admit: 2012-08-29 | Discharge: 2012-09-03 | DRG: 370 | Disposition: A | Discharge status: Home / Self Care | Payer: BC Managed Care – PPO | Source: Ambulatory Visit | Attending: Obstetrics & Gynecology | Admitting: Obstetrics & Gynecology

## 2012-08-29 ENCOUNTER — Encounter (HOSPITAL_COMMUNITY): Payer: Self-pay

## 2012-08-29 ENCOUNTER — Encounter (HOSPITAL_COMMUNITY): Payer: Self-pay | Admitting: Pharmacist

## 2012-08-29 VITALS — BP 93/53 | HR 87 | Temp 98.2°F | Resp 18 | Ht 60.0 in | Wt 189.0 lb

## 2012-08-29 DIAGNOSIS — I951 Orthostatic hypotension: Secondary | ICD-10-CM

## 2012-08-29 DIAGNOSIS — R5381 Other malaise: Secondary | ICD-10-CM

## 2012-08-29 DIAGNOSIS — R5383 Other fatigue: Secondary | ICD-10-CM

## 2012-08-29 DIAGNOSIS — R55 Syncope and collapse: Secondary | ICD-10-CM

## 2012-08-29 DIAGNOSIS — D62 Acute posthemorrhagic anemia: Secondary | ICD-10-CM

## 2012-08-29 DIAGNOSIS — O9903 Anemia complicating the puerperium: Secondary | ICD-10-CM | POA: Diagnosis not present

## 2012-08-29 DIAGNOSIS — R002 Palpitations: Secondary | ICD-10-CM

## 2012-08-29 DIAGNOSIS — E079 Disorder of thyroid, unspecified: Secondary | ICD-10-CM

## 2012-08-29 DIAGNOSIS — K829 Disease of gallbladder, unspecified: Secondary | ICD-10-CM

## 2012-08-29 DIAGNOSIS — Z98891 History of uterine scar from previous surgery: Secondary | ICD-10-CM

## 2012-08-29 DIAGNOSIS — J45909 Unspecified asthma, uncomplicated: Secondary | ICD-10-CM

## 2012-08-29 DIAGNOSIS — K811 Chronic cholecystitis: Secondary | ICD-10-CM | POA: Diagnosis present

## 2012-08-29 DIAGNOSIS — D689 Coagulation defect, unspecified: Secondary | ICD-10-CM | POA: Diagnosis present

## 2012-08-29 DIAGNOSIS — D696 Thrombocytopenia, unspecified: Secondary | ICD-10-CM

## 2012-08-29 DIAGNOSIS — O99119 Other diseases of the blood and blood-forming organs and certain disorders involving the immune mechanism complicating pregnancy, unspecified trimester: Secondary | ICD-10-CM | POA: Diagnosis present

## 2012-08-29 DIAGNOSIS — W1781XA Fall down embankment (hill), initial encounter: Secondary | ICD-10-CM

## 2012-08-29 DIAGNOSIS — R51 Headache: Secondary | ICD-10-CM

## 2012-08-29 DIAGNOSIS — O26899 Other specified pregnancy related conditions, unspecified trimester: Secondary | ICD-10-CM

## 2012-08-29 DIAGNOSIS — R42 Dizziness and giddiness: Secondary | ICD-10-CM

## 2012-08-29 DIAGNOSIS — E663 Overweight: Secondary | ICD-10-CM

## 2012-08-29 DIAGNOSIS — F329 Major depressive disorder, single episode, unspecified: Secondary | ICD-10-CM

## 2012-08-29 DIAGNOSIS — O9912 Other diseases of the blood and blood-forming organs and certain disorders involving the immune mechanism complicating childbirth: Secondary | ICD-10-CM | POA: Diagnosis present

## 2012-08-29 HISTORY — DX: Nausea with vomiting, unspecified: Z98.890

## 2012-08-29 HISTORY — DX: Other specified postprocedural states: R11.2

## 2012-08-29 HISTORY — DX: History of uterine scar from previous surgery: Z98.891

## 2012-08-29 HISTORY — DX: Thrombocytopenia, unspecified: D69.6

## 2012-08-29 HISTORY — DX: Other diseases of the blood and blood-forming organs and certain disorders involving the immune mechanism complicating pregnancy, unspecified trimester: O99.119

## 2012-08-29 HISTORY — DX: Unspecified blood type, rh negative: Z67.91

## 2012-08-29 HISTORY — DX: Other specified pregnancy related conditions, unspecified trimester: O26.899

## 2012-08-29 LAB — CBC
HCT: 33.8 % — ABNORMAL LOW (ref 36.0–46.0)
Hemoglobin: 11.3 g/dL — ABNORMAL LOW (ref 12.0–15.0)
MCH: 28.4 pg (ref 26.0–34.0)
MCHC: 33.4 g/dL (ref 30.0–36.0)

## 2012-08-29 MED ORDER — ACETAMINOPHEN 325 MG PO TABS: 650.0000 mg | ORAL_TABLET | ORAL | Status: DC | PRN

## 2012-08-29 MED ORDER — OXYTOCIN 40 UNITS IN LACTATED RINGERS INFUSION - SIMPLE MED: 1.0000 m[IU]/min | INTRAVENOUS | Status: DC

## 2012-08-29 MED ORDER — ZOLPIDEM TARTRATE 5 MG PO TABS
5.0000 mg | ORAL_TABLET | Freq: Every evening | ORAL | Status: DC | PRN
  Administered 2012-08-30: 5 mg via ORAL
  Filled 2012-08-29 (×2): qty 1

## 2012-08-29 MED ORDER — FLEET ENEMA 7-19 GM/118ML RE ENEM: 1.0000 | ENEMA | RECTAL | Status: DC | PRN

## 2012-08-29 MED ORDER — OXYTOCIN 40 UNITS IN LACTATED RINGERS INFUSION - SIMPLE MED: 62.5000 mL/h | Freq: Once | INTRAVENOUS | Status: DC

## 2012-08-29 MED ORDER — OXYTOCIN BOLUS FROM INFUSION
500.0000 mL | Freq: Once | INTRAVENOUS | Status: DC
  Filled 2012-08-29: qty 500

## 2012-08-29 MED ORDER — LIDOCAINE HCL (PF) 1 % IJ SOLN: 30.0000 mL | INTRAMUSCULAR | Status: DC | PRN

## 2012-08-29 MED ORDER — OXYCODONE-ACETAMINOPHEN 5-325 MG PO TABS: 1.0000 | ORAL_TABLET | ORAL | Status: DC | PRN

## 2012-08-29 MED ORDER — LACTATED RINGERS IV SOLN: 500.0000 mL | INTRAVENOUS | Status: DC | PRN

## 2012-08-29 MED ORDER — CITRIC ACID-SODIUM CITRATE 334-500 MG/5ML PO SOLN: 30.0000 mL | ORAL | Status: DC | PRN

## 2012-08-29 MED ORDER — IBUPROFEN 600 MG PO TABS: 600.0000 mg | ORAL_TABLET | Freq: Four times a day (QID) | ORAL | Status: DC | PRN

## 2012-08-29 MED ORDER — MISOPROSTOL 25 MCG QUARTER TABLET
25.0000 ug | ORAL_TABLET | ORAL | Status: DC | PRN
  Administered 2012-08-29 – 2012-08-30 (×2): 25 ug via VAGINAL
  Filled 2012-08-29 (×2): qty 0.25

## 2012-08-29 MED ORDER — TERBUTALINE SULFATE 1 MG/ML IJ SOLN: 0.2500 mg | Freq: Once | INTRAMUSCULAR | Status: AC | PRN

## 2012-08-29 MED ORDER — ONDANSETRON HCL 4 MG/2ML IJ SOLN: 4.0000 mg | Freq: Four times a day (QID) | INTRAMUSCULAR | Status: DC | PRN

## 2012-08-29 MED ORDER — LACTATED RINGERS IV SOLN
INTRAVENOUS | Status: DC
  Administered 2012-08-29: 20:00:00 via INTRAVENOUS

## 2012-08-29 MED ORDER — BUTORPHANOL TARTRATE 1 MG/ML IJ SOLN: 1.0000 mg | INTRAMUSCULAR | Status: DC | PRN

## 2012-08-29 NOTE — H&P (Signed)
Tracey Reyes, Tracey Reyes             ACCOUNT NO.:  [id number]  MEDICAL RECORD NO.:  [id number]  LOCATION:  9168                          FACILITY:  [facility]  PHYSICIAN:  Lenoard Aden, M.D.DATE OF BIRTH:  [birth year]  DATE OF ADMISSION:  08/29/2012 DATE OF DISCHARGE:                             HISTORY & PHYSICAL   CHIEF COMPLAINT:  Thirty-seven week intrauterine pregnancy with intermittent biliary colic and need for cholecystectomy, 4 times. Delivery at 37 weeks.  This case was discussed with Internal Fetal Medicine, Dr. Pricilla Holm who concurs with delivering at 37 weeks.  PAST MEDICAL HISTORY:  Remarkable for biliary colic as noted, anxiety, PCOS__________ and asthma.  She is a nonsmoker, nondrinker.  She denies domestic or physical violence.  ALLERGIES:  Sulfa drugs, Macrobid, Dexilant, penicillin, and Ceclor.  MEDICATIONS:  Include prenatal vitamins, BuSpar p.r.n., Flexeril p.r.n., Actigall p.r.n., Percocet p.r.n. biliary colic.  PAST SURGICAL HISTORY:  She has past surgical history remarkable for D and C for missed AB, __________ surgery and appendectomy.  FAMILY HISTORY:  Laryngeal cancer, heart disease, diabetes, myocardial infarction, hypertension, and depression.  Prenatal course was complicated by intermittent anxiety __________ severe upper abdominal pain, which __________ pregnancy with the decision to manage her gallbladder disease expectantly throughout pregnancy and on Actigall; however, over the past 3-4 weeks, she has presented twice to the emergency room with acute episodes of biliary colic.  She has been seen by General Surgery who recommended cholecystectomy postpartum.  She is requiring Percocet use for this ongoing biliary colic.  PHYSICAL EXAMINATION:  GENERAL:  She is a well-developed, well- nourished, white female, in no acute distress. HEENT:  Normal. NECK:  Supple.  Full range of motion. LUNGS:  Clear. HEART:  Regular rhythm. ABDOMEN:  Soft, gravid,  nontender.  Estimated fetal weight 6-1/2 pounds. Cervix is 1 cm, 70%, vertex, -1.  Cytotec was placed. EXTREMITIES:  There are no cords. NEUROLOGIC:  Nonfocal. SKIN:  Intact.  NST is reactive with fetal heart tones in the 120-125 beat per minute range with accelerations noted category 1 tracing.  Irregular contractions noted.  IMPRESSION: 1. Thirty-seven week intrauterine pregnancy. 2. Anxiety disorder. 3. History of biliary colic with known gallbladder disease with     chronic cholecystitis with need for cholecystectomy.  PLAN:  To admit, Cytotec was administered.  Pitocin in a.m.  Anticipate attempts at vaginal delivery.     Lenoard Aden, M.D.     RJT/MEDQ  D:  08/29/2012  T:  08/29/2012  Job:  [id number]

## 2012-08-29 NOTE — Progress Notes (Signed)
Tracey Reyes is a 26 y.o. G3P0020 at [redacted]w[redacted]d by LMP admitted for induction of labor due to chronic cholecystits.  Subjective: comfortable  Objective: BP 97/62  Pulse 88  Temp 98.2 F (36.8 C) (Oral)  Resp 20  Ht 5' (1.524 m)  Wt 85.73 kg (189 lb)  BMI 36.91 kg/m2  LMP 12/12/2011      FHT:  FHR: 120 bpm, variability: moderate,  accelerations:  Present,  decelerations:  Absent UC:   irregular, every 5 minutes SVE:   Dilation: 1 Effacement (%): 50 Station: -3 Exam by:: c soliz rn  Labs: Lab Results  Component Value Date   WBC 9.8 08/29/2012   HGB 11.3* 08/29/2012   HCT 33.8* 08/29/2012   MCV 84.9 08/29/2012   PLT 142* 08/29/2012    Assessment / Plan: Induction of labor due to chronic cholecystitis,  progressing well on pitocin  Labor: Progressing normally Preeclampsia:  na Fetal Wellbeing:  Category I Pain Control:  Labor support without medications I/D:  n/a Anticipated MOD:  NSVD  Tracey Reyes 08/29/2012, 10:34 PM

## 2012-08-30 ENCOUNTER — Encounter (HOSPITAL_COMMUNITY)
Admission: RE | Disposition: A | Discharge status: Home / Self Care | Payer: Self-pay | Source: Ambulatory Visit | Attending: Obstetrics & Gynecology

## 2012-08-30 LAB — TYPE AND SCREEN: DAT, IgG: NEGATIVE

## 2012-08-30 LAB — RPR: RPR Ser Ql: NONREACTIVE

## 2012-08-30 SURGERY — Surgical Case
Anesthesia: Epidural | Site: Abdomen | Wound class: Clean Contaminated

## 2012-08-30 MED ORDER — LACTATED RINGERS IV SOLN: 500.0000 mL | Freq: Once | INTRAVENOUS | Status: DC

## 2012-08-30 MED ORDER — LACTATED RINGERS IV SOLN
500.0000 mL | INTRAVENOUS | Status: DC | PRN
  Administered 2012-08-30: 1000 mL via INTRAVENOUS

## 2012-08-30 MED ORDER — MISOPROSTOL 25 MCG QUARTER TABLET: 25.0000 ug | ORAL_TABLET | ORAL | Status: DC | PRN

## 2012-08-30 MED ORDER — OXYTOCIN 40 UNITS IN LACTATED RINGERS INFUSION - SIMPLE MED: 62.5000 mL/h | Freq: Once | INTRAVENOUS | Status: DC

## 2012-08-30 MED ORDER — BUTORPHANOL TARTRATE 1 MG/ML IJ SOLN: 1.0000 mg | INTRAMUSCULAR | Status: DC | PRN

## 2012-08-30 MED ORDER — FENTANYL 2.5 MCG/ML BUPIVACAINE 1/10 % EPIDURAL INFUSION (WH - ANES)
14.0000 mL/h | INTRAMUSCULAR | Status: DC
  Administered 2012-08-30 (×2): 14 mL/h via EPIDURAL
  Filled 2012-08-30 (×3): qty 60

## 2012-08-30 MED ORDER — EPHEDRINE 5 MG/ML INJ
10.0000 mg | INTRAVENOUS | Status: DC | PRN
  Filled 2012-08-30: qty 4

## 2012-08-30 MED ORDER — PHENYLEPHRINE 40 MCG/ML (10ML) SYRINGE FOR IV PUSH (FOR BLOOD PRESSURE SUPPORT): 80.0000 ug | PREFILLED_SYRINGE | INTRAVENOUS | Status: DC | PRN

## 2012-08-30 MED ORDER — TERBUTALINE SULFATE 1 MG/ML IJ SOLN: 0.2500 mg | Freq: Once | INTRAMUSCULAR | Status: AC | PRN

## 2012-08-30 MED ORDER — PHENYLEPHRINE 40 MCG/ML (10ML) SYRINGE FOR IV PUSH (FOR BLOOD PRESSURE SUPPORT)
80.0000 ug | PREFILLED_SYRINGE | INTRAVENOUS | Status: DC | PRN
  Filled 2012-08-30: qty 5

## 2012-08-30 MED ORDER — ONDANSETRON HCL 4 MG/2ML IJ SOLN
4.0000 mg | Freq: Four times a day (QID) | INTRAMUSCULAR | Status: DC | PRN
  Administered 2012-08-30: 4 mg via INTRAVENOUS
  Filled 2012-08-30: qty 2

## 2012-08-30 MED ORDER — OXYTOCIN BOLUS FROM INFUSION
500.0000 mL | Freq: Once | INTRAVENOUS | Status: DC
  Filled 2012-08-30: qty 500

## 2012-08-30 MED ORDER — IBUPROFEN 600 MG PO TABS: 600.0000 mg | ORAL_TABLET | Freq: Four times a day (QID) | ORAL | Status: DC | PRN

## 2012-08-30 MED ORDER — SODIUM BICARBONATE 8.4 % IV SOLN
INTRAVENOUS | Status: DC | PRN
  Administered 2012-08-30: 3 mL via EPIDURAL

## 2012-08-30 MED ORDER — FENTANYL 2.5 MCG/ML BUPIVACAINE 1/10 % EPIDURAL INFUSION (WH - ANES)
INTRAMUSCULAR | Status: DC | PRN
  Administered 2012-08-30: 14 mL/h via EPIDURAL

## 2012-08-30 MED ORDER — DIPHENHYDRAMINE HCL 50 MG/ML IJ SOLN
12.5000 mg | INTRAMUSCULAR | Status: DC | PRN
  Administered 2012-08-30: 12.5 mg via INTRAVENOUS
  Filled 2012-08-30: qty 1

## 2012-08-30 MED ORDER — OXYTOCIN 40 UNITS IN LACTATED RINGERS INFUSION - SIMPLE MED
1.0000 m[IU]/min | INTRAVENOUS | Status: DC
  Administered 2012-08-30: 14 m[IU]/min via INTRAVENOUS
  Administered 2012-08-30: 20 m[IU]/min via INTRAVENOUS
  Administered 2012-08-30: 2 m[IU]/min via INTRAVENOUS
  Administered 2012-08-30: 24 m[IU]/min via INTRAVENOUS
  Administered 2012-08-30: 26 m[IU]/min via INTRAVENOUS
  Filled 2012-08-30: qty 1000

## 2012-08-30 MED ORDER — OXYCODONE-ACETAMINOPHEN 5-325 MG PO TABS: 1.0000 | ORAL_TABLET | ORAL | Status: DC | PRN

## 2012-08-30 MED ORDER — CITRIC ACID-SODIUM CITRATE 334-500 MG/5ML PO SOLN
30.0000 mL | ORAL | Status: DC | PRN
  Administered 2012-08-30: 30 mL via ORAL
  Filled 2012-08-30: qty 15

## 2012-08-30 MED ORDER — ACETAMINOPHEN 325 MG PO TABS: 650.0000 mg | ORAL_TABLET | ORAL | Status: DC | PRN

## 2012-08-30 MED ORDER — LIDOCAINE HCL (PF) 1 % IJ SOLN
30.0000 mL | INTRAMUSCULAR | Status: DC | PRN
  Filled 2012-08-30: qty 30

## 2012-08-30 MED ORDER — LACTATED RINGERS IV SOLN
INTRAVENOUS | Status: DC
  Administered 2012-08-30: 125 mL/h via INTRAVENOUS
  Administered 2012-08-30: 04:00:00 via INTRAVENOUS
  Administered 2012-08-30: 125 mL/h via INTRAVENOUS
  Administered 2012-08-30: 22:00:00 via INTRAVENOUS

## 2012-08-30 MED ORDER — CLINDAMYCIN PHOSPHATE 900 MG/50ML IV SOLN
900.0000 mg | Freq: Once | INTRAVENOUS | Status: AC
  Administered 2012-08-31: 900 mg via INTRAVENOUS
  Filled 2012-08-30: qty 50

## 2012-08-30 MED ORDER — FLEET ENEMA 7-19 GM/118ML RE ENEM: 1.0000 | ENEMA | RECTAL | Status: DC | PRN

## 2012-08-30 MED ORDER — ZOLPIDEM TARTRATE 5 MG PO TABS: 5.0000 mg | ORAL_TABLET | Freq: Every evening | ORAL | Status: DC | PRN

## 2012-08-30 SURGICAL SUPPLY — 34 items
CLOTH BEACON ORANGE TIMEOUT ST (SAFETY) ×2 IMPLANT
CONTAINER PREFILL 10% NBF 15ML (MISCELLANEOUS) IMPLANT
DRAPE SURG 17X23 STRL (DRAPES) ×2 IMPLANT
DRESSING TELFA 8X3 (GAUZE/BANDAGES/DRESSINGS) IMPLANT
DRSG COVADERM 4X10 (GAUZE/BANDAGES/DRESSINGS) IMPLANT
DURAPREP 26ML APPLICATOR (WOUND CARE) ×2 IMPLANT
ELECT REM PT RETURN 9FT ADLT (ELECTROSURGICAL) ×2
ELECTRODE REM PT RTRN 9FT ADLT (ELECTROSURGICAL) ×1 IMPLANT
EXTRACTOR VACUUM M CUP 4 TUBE (SUCTIONS) IMPLANT
GAUZE SPONGE 4X4 12PLY STRL LF (GAUZE/BANDAGES/DRESSINGS) ×4 IMPLANT
GLOVE BIO SURGEON STRL SZ7.5 (GLOVE) ×4 IMPLANT
GOWN PREVENTION PLUS LG XLONG (DISPOSABLE) ×4 IMPLANT
GOWN PREVENTION PLUS XLARGE (GOWN DISPOSABLE) ×2 IMPLANT
KIT ABG SYR 3ML LUER SLIP (SYRINGE) IMPLANT
NDL HYPO 25X1 1.5 SAFETY (NEEDLE) ×1 IMPLANT
NDL HYPO 25X5/8 SAFETYGLIDE (NEEDLE) IMPLANT
NEEDLE HYPO 25X1 1.5 SAFETY (NEEDLE) ×2 IMPLANT
NEEDLE HYPO 25X5/8 SAFETYGLIDE (NEEDLE) IMPLANT
NS IRRIG 1000ML POUR BTL (IV SOLUTION) ×2 IMPLANT
PACK C SECTION WH (CUSTOM PROCEDURE TRAY) ×2 IMPLANT
PAD ABD 7.5X8 STRL (GAUZE/BANDAGES/DRESSINGS) IMPLANT
PAD OB MATERNITY 4.3X12.25 (PERSONAL CARE ITEMS) IMPLANT
SLEEVE SCD COMPRESS KNEE MED (MISCELLANEOUS) ×1 IMPLANT
STAPLER VISISTAT 35W (STAPLE) ×3 IMPLANT
SUT MNCRL 0 VIOLET CTX 36 (SUTURE) ×2 IMPLANT
SUT MON AB 2-0 CT1 27 (SUTURE) ×2 IMPLANT
SUT MON AB-0 CT1 36 (SUTURE) ×4 IMPLANT
SUT MONOCRYL 0 CTX 36 (SUTURE) ×2
SUT PLAIN 0 NONE (SUTURE) IMPLANT
SUT PLAIN 2 0 XLH (SUTURE) ×1 IMPLANT
SYR CONTROL 10ML LL (SYRINGE) ×2 IMPLANT
TOWEL OR 17X24 6PK STRL BLUE (TOWEL DISPOSABLE) ×4 IMPLANT
TRAY FOLEY CATH 14FR (SET/KITS/TRAYS/PACK) ×1 IMPLANT
WATER STERILE IRR 1000ML POUR (IV SOLUTION) ×2 IMPLANT

## 2012-08-30 NOTE — Progress Notes (Signed)
Tracey Reyes is a 26 y.o. G3P0020 at [redacted]w[redacted]d by LMP admitted for induction of labor due to chronic gallbladder disease.  Subjective: comfortable  Objective: BP 101/63  Pulse 87  Temp 98.7 F (37.1 C) (Oral)  Resp 18  Ht 5' (1.524 m)  Wt 85.73 kg (189 lb)  BMI 36.91 kg/m2  LMP 12/12/2011      FHT:  FHR: 130 bpm, variability: moderate,  accelerations:  Present,  decelerations:  Absent UC:   regular, every 5 minutes SVE:   Dilation: 2.5 Effacement (%): 70 Station: -3 Exam by:: DBiggs, RN  Labs: Lab Results  Component Value Date   WBC 9.8 08/29/2012   HGB 11.3* 08/29/2012   HCT 33.8* 08/29/2012   MCV 84.9 08/29/2012   PLT 142* 08/29/2012    Assessment / Plan: Induction of labor due to gallbladder disease,  progressing well on pitocin  Labor: Progressing normally Preeclampsia:  na Fetal Wellbeing:  Category I Pain Control:  Labor support without medications I/D:  n/a Anticipated MOD:  NSVD  Ted Leonhart J 08/30/2012, 8:19 AM

## 2012-08-30 NOTE — Anesthesia Procedure Notes (Signed)
Epidural Patient location during procedure: OB  Preanesthetic Checklist Completed: patient identified, site marked, surgical consent, pre-op evaluation, timeout performed, IV checked, risks and benefits discussed and monitors and equipment checked  Epidural Patient position: sitting Prep: site prepped and draped and DuraPrep Patient monitoring: continuous pulse ox and blood pressure Approach: midline Injection technique: LOR air  Needle:  Needle type: Tuohy  Needle gauge: 17 G Needle length: 9 cm and 9 Needle insertion depth: 7 cm Catheter type: closed end flexible Catheter size: 19 Gauge Catheter at skin depth: 14 cm Test dose: negative  Assessment Events: blood not aspirated, injection not painful, no injection resistance, negative IV test and no paresthesia  Additional Notes Dosing of Epidural:  1st dose, through needle ............................................. epi 1:200K + Xylocaine 40 mg  2nd dose, through catheter, after waiting 3 minutes.....epi 1:200K + Xylocaine 40 mg  3rd dose, through catheter after waiting 3 minutes .............................Marcaine   4mg   ( mg Marcaine are expressed as equivilent  cc's medication removed from the 0.1%Bupiv / fentanyl syringe from L&D pump)  ( 2% Xylo charted as a single dose in Epic Meds for ease of charting; actual dosing was fractionated as above, for saftey's sake)  As each dose occurred, patient was free of IV sx; and patient exhibited no evidence of SA injection.  Patient is more comfortable after epidural dosed. Please see RN's note for documentation of vital signs,and FHR which are stable.  Patient reminded not to try to ambulate with numb legs, and that an RN must be present the 1st time she attempts to get up.    

## 2012-08-30 NOTE — Anesthesia Preprocedure Evaluation (Signed)
Anesthesia Evaluation  Patient identified by MRN, date of birth, ID band Patient awake    Reviewed: Allergy & Precautions, H&P , Patient's Chart, lab work & pertinent test results  Airway Mallampati: IV  TM Distance: >3 FB Neck ROM: full    Dental  (+) Teeth Intact   Pulmonary  breath sounds clear to auscultation        Cardiovascular Rhythm:regular Rate:Normal     Neuro/Psych    GI/Hepatic   Endo/Other  Morbid obesity  Renal/GU      Musculoskeletal   Abdominal   Peds  Hematology   Anesthesia Other Findings       Reproductive/Obstetrics (+) Pregnancy                            Anesthesia Physical Anesthesia Plan  ASA: III  Anesthesia Plan: Epidural   Post-op Pain Management:    Induction:   Airway Management Planned:   Additional Equipment:   Intra-op Plan:   Post-operative Plan:   Informed Consent: I have reviewed the patients History and Physical, chart, labs and discussed the procedure including the risks, benefits and alternatives for the proposed anesthesia with the patient or authorized representative who has indicated his/her understanding and acceptance.   Dental Advisory Given  Plan Discussed with:   Anesthesia Plan Comments: (Labs checked- platelets confirmed with RN in room. Fetal heart tracing, per RN, reported to be stable enough for sitting procedure. Discussed epidural, and patient consents to the procedure:  included risk of possible headache,backache, failed block, allergic reaction, and nerve injury. This patient was asked if she had any questions or concerns before the procedure started.)        Anesthesia Quick Evaluation  

## 2012-08-30 NOTE — Progress Notes (Signed)
Tracey Reyes is a 26 y.o. G3P0020 at [redacted]w[redacted]d by LMP admitted for induction of labor due to Chronic cholecystits on multiple meds.  Subjective: comfortable   Objective: BP 119/67  Pulse 75  Temp 98.7 F (37.1 C) (Oral)  Resp 18  Ht 5' (1.524 m)  Wt 85.73 kg (189 lb)  BMI 36.91 kg/m2  SpO2 99%  LMP 12/12/2011      FHT:  FHR: 130 bpm, variability: moderate,  accelerations:  Present,  decelerations:  Absent UC:   regular, every 2-3 minutes SVE:   Dilation: 5.5 Effacement (%): 70 Station: -2 Exam by:: Penley, RN  150-200 IUPC MVU  Labs: Lab Results  Component Value Date   WBC 9.8 08/29/2012   HGB 11.3* 08/29/2012   HCT 33.8* 08/29/2012   MCV 84.9 08/29/2012   PLT 142* 08/29/2012    Assessment / Plan: Arrest in active phase of labor  Labor: No cervical change x 5 hours Preeclampsia:  na Fetal Wellbeing:  Category I Pain Control:  Epidural I/D:  n/a Anticipated MOD:  Proceed with Cesarean section. Risks of surgery discussed. Consent signed. Pt desires to proceed.  Tahj Njoku J 08/30/2012, 11:26 PM

## 2012-08-30 NOTE — Progress Notes (Signed)
Tracey Reyes is a 26 y.o. G3P0020 at [redacted]w[redacted]d by LMP admitted for induction of labor due to chronic cholecystitis.  Subjective: Comfortable  Objective: BP 112/65  Pulse 71  Temp 98.7 F (37.1 C) (Oral)  Resp 18  Ht 5' (1.524 m)  Wt 85.73 kg (189 lb)  BMI 36.91 kg/m2  SpO2 99%  LMP 12/12/2011      FHT:  FHR: 155 bpm, variability: moderate,  accelerations:  Present,  decelerations:  Absent UC:   regular, every 2-3 minutes SVE:   Dilation: 5.5 Effacement (%): 70 Station: -2 Exam by:: Penley, RN  150-200 MVU by IUPC  Labs: Lab Results  Component Value Date   WBC 9.8 08/29/2012   HGB 11.3* 08/29/2012   HCT 33.8* 08/29/2012   MCV 84.9 08/29/2012   PLT 142* 08/29/2012    Assessment / Plan: Chronic Cholecystitis Prolonged Active Phase  Labor: On Pitocin with minimal progress Preeclampsia:  labs stable Fetal Wellbeing:  Category I Pain Control:  Epidural I/D:  n/a Anticipated MOD:  Will recheck in  one hour and consider cesarean section if no change  Tracey Reyes J 08/30/2012, 9:31 PM

## 2012-08-31 ENCOUNTER — Inpatient Hospital Stay (HOSPITAL_COMMUNITY): Payer: BC Managed Care – PPO | Admitting: Anesthesiology

## 2012-08-31 ENCOUNTER — Encounter (HOSPITAL_COMMUNITY): Payer: Self-pay | Admitting: Anesthesiology

## 2012-08-31 ENCOUNTER — Encounter (HOSPITAL_COMMUNITY): Payer: Self-pay

## 2012-08-31 DIAGNOSIS — O99119 Other diseases of the blood and blood-forming organs and certain disorders involving the immune mechanism complicating pregnancy, unspecified trimester: Secondary | ICD-10-CM | POA: Diagnosis present

## 2012-08-31 DIAGNOSIS — D696 Thrombocytopenia, unspecified: Secondary | ICD-10-CM | POA: Diagnosis present

## 2012-08-31 DIAGNOSIS — Z98891 History of uterine scar from previous surgery: Secondary | ICD-10-CM

## 2012-08-31 DIAGNOSIS — D62 Acute posthemorrhagic anemia: Secondary | ICD-10-CM | POA: Diagnosis not present

## 2012-08-31 HISTORY — DX: Thrombocytopenia, unspecified: D69.6

## 2012-08-31 HISTORY — DX: History of uterine scar from previous surgery: Z98.891

## 2012-08-31 LAB — CBC
Hemoglobin: 9.5 g/dL — ABNORMAL LOW (ref 12.0–15.0)
RBC: 3.29 MIL/uL — ABNORMAL LOW (ref 3.87–5.11)
WBC: 15 10*3/uL — ABNORMAL HIGH (ref 4.0–10.5)

## 2012-08-31 LAB — CCBB MATERNAL DONOR DRAW

## 2012-08-31 MED ORDER — NALBUPHINE HCL 10 MG/ML IJ SOLN
5.0000 mg | INTRAMUSCULAR | Status: DC | PRN
  Administered 2012-08-31: 5 mg via INTRAVENOUS
  Administered 2012-08-31: 10 mg via INTRAVENOUS
  Filled 2012-08-31 (×3): qty 1

## 2012-08-31 MED ORDER — SENNOSIDES-DOCUSATE SODIUM 8.6-50 MG PO TABS
2.0000 | ORAL_TABLET | Freq: Every day | ORAL | Status: DC
  Administered 2012-08-31 – 2012-09-02 (×3): 2 via ORAL

## 2012-08-31 MED ORDER — NALOXONE HCL 0.4 MG/ML IJ SOLN: 0.4000 mg | INTRAMUSCULAR | Status: DC | PRN

## 2012-08-31 MED ORDER — WITCH HAZEL-GLYCERIN EX PADS: 1.0000 "application " | MEDICATED_PAD | CUTANEOUS | Status: DC | PRN

## 2012-08-31 MED ORDER — BUPIVACAINE HCL (PF) 0.25 % IJ SOLN
INTRAMUSCULAR | Status: AC
  Filled 2012-08-31: qty 30

## 2012-08-31 MED ORDER — ALBUTEROL SULFATE HFA 108 (90 BASE) MCG/ACT IN AERS: 1.0000 | INHALATION_SPRAY | RESPIRATORY_TRACT | Status: DC

## 2012-08-31 MED ORDER — ZOLPIDEM TARTRATE 5 MG PO TABS: 5.0000 mg | ORAL_TABLET | Freq: Every evening | ORAL | Status: DC | PRN

## 2012-08-31 MED ORDER — LANOLIN HYDROUS EX OINT: 1.0000 "application " | TOPICAL_OINTMENT | CUTANEOUS | Status: DC | PRN

## 2012-08-31 MED ORDER — MEPERIDINE HCL 25 MG/ML IJ SOLN
INTRAMUSCULAR | Status: DC | PRN
  Administered 2012-08-31 (×2): 6 mg via INTRAVENOUS
  Administered 2012-08-31: 7 mg via INTRAVENOUS
  Administered 2012-08-31: 6 mg via INTRAVENOUS

## 2012-08-31 MED ORDER — MEPERIDINE HCL 25 MG/ML IJ SOLN: 6.2500 mg | INTRAMUSCULAR | Status: DC | PRN

## 2012-08-31 MED ORDER — OXYCODONE-ACETAMINOPHEN 5-325 MG PO TABS
1.0000 | ORAL_TABLET | ORAL | Status: DC | PRN
  Administered 2012-08-31 (×2): 1 via ORAL
  Administered 2012-09-01: 2 via ORAL
  Administered 2012-09-01 – 2012-09-03 (×7): 1 via ORAL
  Filled 2012-08-31: qty 2
  Filled 2012-08-31 (×10): qty 1

## 2012-08-31 MED ORDER — DIPHENHYDRAMINE HCL 50 MG/ML IJ SOLN: 12.5000 mg | INTRAMUSCULAR | Status: DC | PRN

## 2012-08-31 MED ORDER — SIMETHICONE 80 MG PO CHEW
80.0000 mg | CHEWABLE_TABLET | Freq: Three times a day (TID) | ORAL | Status: DC
  Administered 2012-08-31 – 2012-09-03 (×12): 80 mg via ORAL

## 2012-08-31 MED ORDER — LACTATED RINGERS IV SOLN
INTRAVENOUS | Status: DC | PRN
  Administered 2012-08-31 (×2): via INTRAVENOUS

## 2012-08-31 MED ORDER — BUPIVACAINE HCL (PF) 0.25 % IJ SOLN
INTRAMUSCULAR | Status: DC | PRN
  Administered 2012-08-31: 30 mL

## 2012-08-31 MED ORDER — KETOROLAC TROMETHAMINE 30 MG/ML IJ SOLN: 15.0000 mg | Freq: Once | INTRAMUSCULAR | Status: DC | PRN

## 2012-08-31 MED ORDER — SODIUM CHLORIDE 0.9 % IV SOLN
1.0000 ug/kg/h | INTRAVENOUS | Status: DC | PRN
  Filled 2012-08-31: qty 2.5

## 2012-08-31 MED ORDER — BUSPIRONE HCL 5 MG PO TABS
5.0000 mg | ORAL_TABLET | Freq: Two times a day (BID) | ORAL | Status: DC | PRN
  Filled 2012-08-31: qty 1

## 2012-08-31 MED ORDER — ONDANSETRON HCL 4 MG/2ML IJ SOLN: 4.0000 mg | INTRAMUSCULAR | Status: DC | PRN

## 2012-08-31 MED ORDER — MENTHOL 3 MG MT LOZG: 1.0000 | LOZENGE | OROMUCOSAL | Status: DC | PRN

## 2012-08-31 MED ORDER — SIMETHICONE 80 MG PO CHEW: 80.0000 mg | CHEWABLE_TABLET | ORAL | Status: DC | PRN

## 2012-08-31 MED ORDER — MORPHINE SULFATE (PF) 0.5 MG/ML IJ SOLN
INTRAMUSCULAR | Status: DC | PRN
  Administered 2012-08-31: 1 mg via INTRAVENOUS

## 2012-08-31 MED ORDER — SCOPOLAMINE 1 MG/3DAYS TD PT72
1.0000 | MEDICATED_PATCH | Freq: Once | TRANSDERMAL | Status: DC
  Administered 2012-08-31: 1.5 mg via TRANSDERMAL

## 2012-08-31 MED ORDER — SCOPOLAMINE 1 MG/3DAYS TD PT72
MEDICATED_PATCH | TRANSDERMAL | Status: AC
  Administered 2012-08-31: 1.5 mg via TRANSDERMAL
  Filled 2012-08-31: qty 1

## 2012-08-31 MED ORDER — DIPHENHYDRAMINE HCL 50 MG/ML IJ SOLN
25.0000 mg | INTRAMUSCULAR | Status: DC | PRN
  Administered 2012-08-31: 25 mg via INTRAMUSCULAR

## 2012-08-31 MED ORDER — METHYLERGONOVINE MALEATE 0.2 MG PO TABS: 0.2000 mg | ORAL_TABLET | ORAL | Status: DC | PRN

## 2012-08-31 MED ORDER — DIPHENHYDRAMINE HCL 25 MG PO CAPS: 25.0000 mg | ORAL_CAPSULE | Freq: Four times a day (QID) | ORAL | Status: DC | PRN

## 2012-08-31 MED ORDER — DIPHENHYDRAMINE HCL 50 MG/ML IJ SOLN
INTRAMUSCULAR | Status: AC
  Administered 2012-08-31: 25 mg via INTRAMUSCULAR
  Filled 2012-08-31: qty 1

## 2012-08-31 MED ORDER — HYDROMORPHONE HCL PF 1 MG/ML IJ SOLN: 0.2500 mg | INTRAMUSCULAR | Status: DC | PRN

## 2012-08-31 MED ORDER — METHYLERGONOVINE MALEATE 0.2 MG/ML IJ SOLN: 0.2000 mg | INTRAMUSCULAR | Status: DC | PRN

## 2012-08-31 MED ORDER — DIBUCAINE 1 % RE OINT: 1.0000 "application " | TOPICAL_OINTMENT | RECTAL | Status: DC | PRN

## 2012-08-31 MED ORDER — SODIUM CHLORIDE 0.9 % IJ SOLN: 3.0000 mL | INTRAMUSCULAR | Status: DC | PRN

## 2012-08-31 MED ORDER — TETANUS-DIPHTH-ACELL PERTUSSIS 5-2.5-18.5 LF-MCG/0.5 IM SUSP: 0.5000 mL | Freq: Once | INTRAMUSCULAR | Status: DC

## 2012-08-31 MED ORDER — METOCLOPRAMIDE HCL 5 MG/ML IJ SOLN: 10.0000 mg | Freq: Three times a day (TID) | INTRAMUSCULAR | Status: DC | PRN

## 2012-08-31 MED ORDER — ONDANSETRON HCL 4 MG/2ML IJ SOLN
INTRAMUSCULAR | Status: DC | PRN
  Administered 2012-08-31: 4 mg via INTRAVENOUS

## 2012-08-31 MED ORDER — IBUPROFEN 600 MG PO TABS
600.0000 mg | ORAL_TABLET | Freq: Four times a day (QID) | ORAL | Status: DC
  Administered 2012-08-31 – 2012-09-03 (×11): 600 mg via ORAL
  Filled 2012-08-31 (×12): qty 1

## 2012-08-31 MED ORDER — OXYTOCIN 40 UNITS IN LACTATED RINGERS INFUSION - SIMPLE MED: 62.5000 mL/h | INTRAVENOUS | Status: AC

## 2012-08-31 MED ORDER — NALBUPHINE HCL 10 MG/ML IJ SOLN
5.0000 mg | INTRAMUSCULAR | Status: DC | PRN
  Filled 2012-08-31: qty 1

## 2012-08-31 MED ORDER — LIDOCAINE-EPINEPHRINE (PF) 2 %-1:200000 IJ SOLN
INTRAMUSCULAR | Status: AC
  Filled 2012-08-31: qty 20

## 2012-08-31 MED ORDER — LACTATED RINGERS IV SOLN
INTRAVENOUS | Status: DC
  Administered 2012-08-31 (×4): via INTRAVENOUS

## 2012-08-31 MED ORDER — NALBUPHINE SYRINGE 5 MG/0.5 ML
INJECTION | INTRAMUSCULAR | Status: AC
  Filled 2012-08-31: qty 1

## 2012-08-31 MED ORDER — KETOROLAC TROMETHAMINE 60 MG/2ML IM SOLN
INTRAMUSCULAR | Status: AC
  Administered 2012-08-31: 60 mg
  Filled 2012-08-31: qty 2

## 2012-08-31 MED ORDER — KETOROLAC TROMETHAMINE 30 MG/ML IJ SOLN: 30.0000 mg | Freq: Four times a day (QID) | INTRAMUSCULAR | Status: AC | PRN

## 2012-08-31 MED ORDER — LACTATED RINGERS IV SOLN
INTRAVENOUS | Status: DC | PRN
  Administered 2012-08-31: 01:00:00 via INTRAVENOUS

## 2012-08-31 MED ORDER — KETOROLAC TROMETHAMINE 30 MG/ML IJ SOLN
30.0000 mg | Freq: Four times a day (QID) | INTRAMUSCULAR | Status: AC | PRN
  Administered 2012-08-31: 30 mg via INTRAVENOUS
  Filled 2012-08-31: qty 1

## 2012-08-31 MED ORDER — OXYTOCIN 10 UNIT/ML IJ SOLN
40.0000 [IU] | INTRAVENOUS | Status: DC | PRN
  Administered 2012-08-31: 40 [IU] via INTRAVENOUS

## 2012-08-31 MED ORDER — PROMETHAZINE HCL 25 MG/ML IJ SOLN: 6.2500 mg | INTRAMUSCULAR | Status: DC | PRN

## 2012-08-31 MED ORDER — AZITHROMYCIN 250 MG PO TABS
250.0000 mg | ORAL_TABLET | Freq: Every day | ORAL | Status: DC
  Administered 2012-08-31 – 2012-09-02 (×3): 250 mg via ORAL
  Filled 2012-08-31 (×5): qty 1

## 2012-08-31 MED ORDER — MORPHINE SULFATE (PF) 0.5 MG/ML IJ SOLN
INTRAMUSCULAR | Status: DC | PRN
  Administered 2012-08-31: 4 mg via EPIDURAL

## 2012-08-31 MED ORDER — ONDANSETRON HCL 4 MG/2ML IJ SOLN
INTRAMUSCULAR | Status: AC
  Filled 2012-08-31: qty 2

## 2012-08-31 MED ORDER — ONDANSETRON HCL 4 MG PO TABS
4.0000 mg | ORAL_TABLET | ORAL | Status: DC | PRN
  Administered 2012-09-02 – 2012-09-03 (×4): 4 mg via ORAL
  Filled 2012-08-31 (×4): qty 1

## 2012-08-31 MED ORDER — SODIUM BICARBONATE 8.4 % IV SOLN
INTRAVENOUS | Status: AC
  Filled 2012-08-31: qty 50

## 2012-08-31 MED ORDER — KETOROLAC TROMETHAMINE 60 MG/2ML IM SOLN
60.0000 mg | Freq: Once | INTRAMUSCULAR | Status: AC | PRN
  Filled 2012-08-31: qty 2

## 2012-08-31 MED ORDER — ONDANSETRON HCL 4 MG/2ML IJ SOLN: 4.0000 mg | Freq: Three times a day (TID) | INTRAMUSCULAR | Status: DC | PRN

## 2012-08-31 MED ORDER — PRENATAL MULTIVITAMIN CH
1.0000 | ORAL_TABLET | Freq: Every day | ORAL | Status: DC
  Administered 2012-09-01: 1 via ORAL
  Filled 2012-08-31 (×4): qty 1

## 2012-08-31 MED ORDER — PHENYLEPHRINE HCL 10 MG/ML IJ SOLN
INTRAMUSCULAR | Status: DC | PRN
  Administered 2012-08-31 (×2): 40 ug via INTRAVENOUS

## 2012-08-31 MED ORDER — DIPHENHYDRAMINE HCL 25 MG PO CAPS
25.0000 mg | ORAL_CAPSULE | ORAL | Status: DC | PRN
  Filled 2012-08-31: qty 1

## 2012-08-31 NOTE — Anesthesia Postprocedure Evaluation (Signed)
  Anesthesia Post-op Note  Patient: Tracey Reyes  Procedure(s) Performed: Procedure(s) (LRB) with comments: CESAREAN SECTION (N/A)  Patient Location: Mother/Baby  Anesthesia Type: Epidural  Level of Consciousness: awake, alert  and oriented  Airway and Oxygen Therapy: Patient Spontanous Breathing  Post-op Pain: none  Post-op Assessment: Post-op Vital signs reviewed and Patient's Cardiovascular Status Stable  Post-op Vital Signs: Reviewed and stable  Complications: No apparent anesthesia complications

## 2012-08-31 NOTE — Addendum Note (Signed)
Addendum  created 08/31/12 1303 by Shanon Payor, CRNA   Modules edited:Notes Section

## 2012-08-31 NOTE — Progress Notes (Signed)
Baby's ABO to be done with PKU.  Mom may need rh studies.

## 2012-08-31 NOTE — Anesthesia Postprocedure Evaluation (Signed)
Anesthesia Post Note  Patient: Tracey Reyes  Procedure(s) Performed: Procedure(s) (LRB): CESAREAN SECTION (N/A)  Anesthesia type: Epidural  Patient location: PACU  Post pain: Pain level controlled  Post assessment: Post-op Vital signs reviewed  Last Vitals:  Filed Vitals:   08/31/12 0145  BP: 89/53  Pulse: 87  Temp:   Resp: 16    Post vital signs: Reviewed  Level of consciousness: awake  Complications: No apparent anesthesia complications

## 2012-08-31 NOTE — Transfer of Care (Signed)
Immediate Anesthesia Transfer of Care Note  Patient: Tracey Reyes  Procedure(s) Performed: Procedure(s) (LRB) with comments: CESAREAN SECTION (N/A)  Patient Location: PACU  Anesthesia Type: Epidural  Level of Consciousness: awake, alert , oriented and patient cooperative  Airway & Oxygen Therapy: Patient Spontanous Breathing  Post-op Assessment: Report given to PACU RN and Post -op Vital signs reviewed and stable  Post vital signs: Reviewed and stable  Complications: No apparent anesthesia complications

## 2012-08-31 NOTE — Op Note (Signed)
Cesarean Section Procedure Note  Indications: failure to progress: arrest of dilation  Pre-operative Diagnosis: 37 week 4 day pregnancy.  Post-operative Diagnosis: same  Surgeon: Lenoard Aden   Assistants: none  Anesthesia: Epidural anesthesia and Local anesthesia 0.25.% bupivacaine  ASA Class: 2  Procedure Details  The patient was seen in the Holding Room. The risks, benefits, complications, treatment options, and expected outcomes were discussed with the patient.  The patient concurred with the proposed plan, giving informed consent. The risks of anesthesia, infection, bleeding and possible injury to other organs discussed. Injury to bowel, bladder, or ureter with possible need for repair discussed. Possible need for transfusion with secondary risks of hepatitis or HIV acquisition discussed. Post operative complications to include but not limited to DVT, PE and Pneumonia noted. The site of surgery properly noted/marked. The patient was taken to Operating Room # 2, identified as Geradine Girt and the procedure verified as C-Section Delivery. A Time Out was held and the above information confirmed.  After induction of anesthesia, the patient was draped and prepped in the usual sterile manner. A Pfannenstiel incision was made and carried down through the subcutaneous tissue to the fascia. Fascial incision was made and extended transversely using Mayo scissors. The fascia was separated from the underlying rectus tissue superiorly and inferiorly. The peritoneum was identified and entered. Peritoneal incision was extended longitudinally. The utero-vesical peritoneal reflection was incised transversely and the bladder flap was bluntly freed from the lower uterine segment. A low transverse uterine incision(Kerr hysterotomy) was made. Delivered from OT asynclitic presentation was a  female with Apgar scores of 9 at one minute and 9 at five minutes. Bulb suctioning gently performed. Neonatal team  in attendance.After the umbilical cord was clamped and cut cord blood was obtained for evaluation. The placenta was removed intact and appeared normal. The uterus was curetted with a dry lap pack. Good hemostasis was noted.The uterine outline, tubes and ovaries appeared normal. The uterine incision was closed with running locked sutures of 0 Monocryl x 2 layers. Hemostasis was observed. Lavage was carried out until clear.The parietal peritoneum was closed with a running 2-0 Monocryl suture. The fascia was then reapproximated with running sutures of 0 Monocryl. 0 plain to approximate subcutaneous tissue. The skin was reapproximated with staples.  Instrument, sponge, and needle counts were correct prior the abdominal closure and at the conclusion of the case.   Findings: above  Estimated Blood Loss:  500         Drains: foley                 Specimens: placenta to path                 Complications:  None; patient tolerated the procedure well.         Disposition: PACU - hemodynamically stable.         Condition: stable  Attending Attestation: I performed the procedure.

## 2012-08-31 NOTE — Progress Notes (Addendum)
Subjective: POD# 0 Information for the patient's newborn:  Tracey Reyes, Tracey Reyes [[id number]]  female   / circ planned  Reports feeling well. Feeding: breast Patient reports tolerating PO.  Breast symptoms: no discomfort. Pain controlled withlong acting anelgesic. Denies HA/SOB/C/P/N/V/dizziness. Flatus present. She reports vaginal bleeding as normal, without clots.  She has sat up in chair, foley cath in place.    Orthostatic VS stable RN reports minimal urine output for past 2 hours.  Objective:   VS:  Filed Vitals:   09/01/12 1539 09/01/12 1540 09/01/12 2200 09/02/12 0613  BP:  91/55 88/56 99/62   Pulse:  70 77 83  Temp: 99.5 F (37.5 C) 98.8 F (37.1 C) 98.5 F (36.9 C) 98 F (36.7 C)  TempSrc: Axillary Oral Oral Oral  Resp:  18 20 19   Height:      Weight:      SpO2:  99%  100%      Intake/Output Summary (Last 24 hours) at 09/02/12 0948 Last data filed at 09/02/12 0300  Gross per 24 hour  Intake   1400 ml  Output    200 ml  Net   1200 ml         Basename 08/31/12 0510  WBC 15.0*  HGB 9.5*  HCT 28.3*  PLT 114*     Blood type: --/--/B NEG (09/22 1950)  Rubella: Immune (03/07 0000)     Physical Exam:  General: alert, cooperative and no distress CV: Regular rate and rhythm Resp: clear Abdomen: soft, nontender, normal bowel sounds Incision: clean, dry, intact and dressing to pfanenstiehl incision Uterine Fundus: firm, below umbilicus, nontender Lochia: minimal Ext: edema +1 pedal and Homans sign is negative, no sign of DVT      Assessment/Plan: 26 y.o.   POD# 0.  s/p Cesarean Delivery.  Indications: failure to progress                Active Problems:  S/P cesarean section (9/24, FTP)  Postpartum care following cesarean delivery  DEPRESSION  Gall bladder disease  Acute blood loss anemia  Gestational thrombocytopenia  Rh negative, maternal  Doing well, stable.    No hemorrhage, asymptomatic with low BP readings Fluid bolus now 500 cc LR,  maintain I&O        Advance diet as tolerated D/C foley this PM Ambulate Routine post-op care  Muneer Leider 09/02/2012, 9:48 AM

## 2012-09-01 MED ORDER — BISACODYL 10 MG RE SUPP
10.0000 mg | Freq: Every day | RECTAL | Status: DC | PRN
  Administered 2012-09-02: 10 mg via RECTAL
  Filled 2012-09-01: qty 1

## 2012-09-01 NOTE — Progress Notes (Addendum)
Subjective: POD# 1 Information for the patient's newborn:  Dejai, Schubach [[id number]]  female   / circ delayed, NB w/ low BS, difficulty latching  Reports feeling well, a bit sore at incision site Feeding: breast and bottle Patient reports tolerating PO.  Breast symptoms: worked w/ LC yesterday, minimal latch Pain controlled with Motrin and Percocet Denies HA/SOB/C/P/N/V/dizziness. Flatus minimal. She reports vaginal bleeding as normal, without clots.  She is ambulating, urinating without difficulty, reports large volume this AM.     Objective:   VS:  Filed Vitals:   09/01/12 1539 09/01/12 1540 09/01/12 2200 09/02/12 0613  BP:  91/55 88/56 99/62   Pulse:  70 77 83  Temp: 99.5 F (37.5 C) 98.8 F (37.1 C) 98.5 F (36.9 C) 98 F (36.7 C)  TempSrc: Axillary Oral Oral Oral  Resp:  18 20 19   Height:      Weight:      SpO2:  99%  100%      Intake/Output Summary (Last 24 hours) at 09/02/12 0948 Last data filed at 09/02/12 0300  Gross per 24 hour  Intake   1400 ml  Output    200 ml  Net   1200 ml         Basename 08/31/12 0510  WBC 15.0*  HGB 9.5*  HCT 28.3*  PLT 114*     Blood type: --/--/B NEG (09/22 1950)  Rubella: Immune (03/07 0000)     Physical Exam:  General: alert, cooperative and no distress CV: Regular rate and rhythm Resp: clear Abdomen: soft, nontender, normal bowel sounds Incision: clean, dry, intact and dressing in place Uterine Fundus: firm, below umbilicus, nontender Lochia: minimal Ext: edema +2 pedal and pretibial and Homans sign is negative, no sign of DVT      Assessment/Plan: 26 y.o.   POD# 1.  s/p Cesarean Delivery.  Indications: failure to progress                Active Problems:  S/P cesarean section (9/24, FTP)  Postpartum care following cesarean delivery  DEPRESSION  Gall bladder disease  Acute blood loss anemia  Gestational thrombocytopenia  Rh negative, maternal  Doing well, stable.    Dependent edema, encouraged  PO fluids, will continue I&O today            Ambulate, warm PO fluids for increased gut motility Shower and DC dressing this AM Routine post-op care  Joh Rao 09/02/2012, 9:48 AM

## 2012-09-01 NOTE — Progress Notes (Signed)
C/O incision pain, abd. Slight distended, active BS, passing gas, pt. Doesn't want to move much, states it hurts to bad. Encouraged ambulation in hall, encouraged warm fluids. Called Daniella of c/o pain, new orders received. Edema less to lower extremities. Ambulated more this morning, stated after shower having increased pain, stated felt achy and having chills. V/s WDL.

## 2012-09-02 ENCOUNTER — Encounter (HOSPITAL_COMMUNITY): Payer: Self-pay

## 2012-09-02 DIAGNOSIS — Z6791 Unspecified blood type, Rh negative: Secondary | ICD-10-CM | POA: Diagnosis present

## 2012-09-02 HISTORY — DX: Unspecified blood type, rh negative: Z67.91

## 2012-09-02 NOTE — Progress Notes (Addendum)
Subjective: POD# 2 Information for the patient's newborn:  Antonietta, Lansdowne [[id number]]  female   / circ done, difficulty latching  Reports feeling well, incision pain improved with passing gas / rectal suppositories, and ambulation. Feeding: breast and bottle Patient reports tolerating PO.  Breast symptoms: working w/ LC  Pain controlled with Motrin and Percocet Denies HA/SOB/C/P/N/V/dizziness. Flatus present. She reports vaginal bleeding as normal, without clots.  She is ambulating, urinating without difficulty.     Objective:   VS:  Filed Vitals:   09/01/12 1539 09/01/12 1540 09/01/12 2200 09/02/12 0613  BP:  91/55 88/56 99/62   Pulse:  70 77 83  Temp: 99.5 F (37.5 C) 98.8 F (37.1 C) 98.5 F (36.9 C) 98 F (36.7 C)  TempSrc: Axillary Oral Oral Oral  Resp:  18 20 19   Height:      Weight:      SpO2:  99%  100%      Intake/Output Summary (Last 24 hours) at 09/02/12 0948 Last data filed at 09/02/12 0300  Gross per 24 hour  Intake   1400 ml  Output    200 ml  Net   1200 ml         Basename 08/31/12 0510  WBC 15.0*  HGB 9.5*  HCT 28.3*  PLT 114*     Blood type: --/--/B NEG (09/22 1950)  Rubella: Immune (03/07 0000)     Physical Exam:  General: alert, cooperative and no distress CV: Regular rate and rhythm Resp: clear Abdomen: soft, nontender, normal bowel sounds Incision: clean, dry, intact and staples intact Uterine Fundus: firm, below umbilicus, nontender Lochia: minimal Ext: edema +2 pedal and pretibial and Homans sign is negative, no sign of DVT      Assessment/Plan: 26 y.o.   POD# 2.  s/p Cesarean Delivery.  Indications: failure to progress                Active Problems:  S/P cesarean section (9/24, FTP)  Postpartum care following cesarean delivery  DEPRESSION  Gall bladder disease  Acute blood loss anemia  Gestational thrombocytopenia  Rh negative, maternal  Doing well, stable.    Dependent edema, encouraged PO fluids             Ambulate, warm PO fluids for increased gut motility Continue LC support for BFing Routine post-op care Anticipate discharge home in AM.   Cledith Kamiya 09/02/2012, 9:48 AM

## 2012-09-03 MED ORDER — OXYCODONE-ACETAMINOPHEN 5-325 MG PO TABS: 1.0000 | ORAL_TABLET | Freq: Four times a day (QID) | ORAL | Status: DC | PRN

## 2012-09-03 MED ORDER — IBUPROFEN 600 MG PO TABS: 600.0000 mg | ORAL_TABLET | Freq: Four times a day (QID) | ORAL | Status: DC

## 2012-09-03 NOTE — Progress Notes (Signed)
Patient ID: Tracey Reyes, female   DOB: 1986-02-11, 26 y.o.   MRN: [id number] POD # 3  Subjective: Pt reports feeling well and eager for d/c home/ Pain controlled with percocet Tolerating po/Voiding without problems/ No n/v/Flatus pos Activity: out of bed and ambulate Bleeding is light Newborn info:  Information for the patient's newborn:  Marlyn, Aragones [[id number]]  female  / circ completed/ Feeding: breast and bottle   Objective: VS: Blood pressure 93/53, pulse 87, temperature 98.2 F (36.8 C), temperature source Oral, resp. rate 18.     Physical Exam:  General: alert, cooperative and no distress CV: Regular rate and rhythm Resp: clear Abdomen: soft, nontender, normal bowel sounds Incision: healing well, well approximated Uterine Fundus: firm, below umbilicus, nontender Lochia: minimal Ext: edema +2 to +3 pedal and pretib and Homans sign is negative, no sign of DVT    A/P: POD # 3/ G3P1021/ S/P Primary C/Section d/t failure to progress Doing well and stable for discharge home RX's: Ibuprofen 600mg  po Q 6 hrs prn pain #30 Refill x 1 Percocet 5/325 1 - 2 tabs po every 6 hrs prn pain  #30 No refill    Signed: Demetrius Revel, MSN, [facility] 09/03/2012, 9:12 AM

## 2012-09-09 ENCOUNTER — Other Ambulatory Visit: Payer: Self-pay | Admitting: Obstetrics and Gynecology

## 2012-09-09 ENCOUNTER — Ambulatory Visit
Admission: RE | Admit: 2012-09-09 | Discharge: 2012-09-09 | Disposition: A | Discharge status: Home / Self Care | Payer: BC Managed Care – PPO | Source: Ambulatory Visit | Attending: Obstetrics and Gynecology | Admitting: Obstetrics and Gynecology

## 2012-09-09 ENCOUNTER — Observation Stay (HOSPITAL_COMMUNITY)
Admission: EM | Admit: 2012-09-09 | Discharge: 2012-09-10 | Disposition: A | Discharge status: Home / Self Care | Payer: BC Managed Care – PPO | Attending: Cardiology | Admitting: Cardiology

## 2012-09-09 ENCOUNTER — Encounter (HOSPITAL_COMMUNITY): Payer: Self-pay | Admitting: Physical Medicine and Rehabilitation

## 2012-09-09 DIAGNOSIS — R079 Chest pain, unspecified: Secondary | ICD-10-CM | POA: Insufficient documentation

## 2012-09-09 DIAGNOSIS — O903 Peripartum cardiomyopathy: Secondary | ICD-10-CM

## 2012-09-09 DIAGNOSIS — D62 Acute posthemorrhagic anemia: Secondary | ICD-10-CM | POA: Diagnosis present

## 2012-09-09 DIAGNOSIS — R0602 Shortness of breath: Secondary | ICD-10-CM

## 2012-09-09 DIAGNOSIS — E8770 Fluid overload, unspecified: Secondary | ICD-10-CM | POA: Diagnosis present

## 2012-09-09 DIAGNOSIS — R05 Cough: Secondary | ICD-10-CM

## 2012-09-09 DIAGNOSIS — N390 Urinary tract infection, site not specified: Secondary | ICD-10-CM | POA: Insufficient documentation

## 2012-09-09 DIAGNOSIS — E8779 Other fluid overload: Secondary | ICD-10-CM

## 2012-09-09 DIAGNOSIS — Z98891 History of uterine scar from previous surgery: Secondary | ICD-10-CM

## 2012-09-09 DIAGNOSIS — R0789 Other chest pain: Secondary | ICD-10-CM

## 2012-09-09 DIAGNOSIS — I517 Cardiomegaly: Secondary | ICD-10-CM | POA: Insufficient documentation

## 2012-09-09 DIAGNOSIS — J90 Pleural effusion, not elsewhere classified: Secondary | ICD-10-CM | POA: Insufficient documentation

## 2012-09-09 LAB — COMPREHENSIVE METABOLIC PANEL
ALT: 18 U/L (ref 0–35)
AST: 16 U/L (ref 0–37)
Albumin: 2.8 g/dL — ABNORMAL LOW (ref 3.5–5.2)
Alkaline Phosphatase: 104 U/L (ref 39–117)
BUN: 8 mg/dL (ref 6–23)
Chloride: 105 mEq/L (ref 96–112)
Potassium: 3.4 mEq/L — ABNORMAL LOW (ref 3.5–5.1)
Sodium: 140 mEq/L (ref 135–145)
Total Bilirubin: 0.3 mg/dL (ref 0.3–1.2)
Total Protein: 6.5 g/dL (ref 6.0–8.3)

## 2012-09-09 LAB — CBC WITH DIFFERENTIAL/PLATELET
Basophils Absolute: 0 10*3/uL (ref 0.0–0.1)
Basophils Relative: 0 % (ref 0–1)
Eosinophils Absolute: 0.1 10*3/uL (ref 0.0–0.7)
Hemoglobin: 9.7 g/dL — ABNORMAL LOW (ref 12.0–15.0)
MCHC: 32.6 g/dL (ref 30.0–36.0)
Monocytes Relative: 7 % (ref 3–12)
Neutro Abs: 6 10*3/uL (ref 1.7–7.7)
Neutrophils Relative %: 71 % (ref 43–77)
Platelets: 335 10*3/uL (ref 150–400)

## 2012-09-09 LAB — URINALYSIS, ROUTINE W REFLEX MICROSCOPIC
Bilirubin Urine: NEGATIVE
Glucose, UA: NEGATIVE mg/dL
Ketones, ur: NEGATIVE mg/dL
Leukocytes, UA: NEGATIVE
Protein, ur: NEGATIVE mg/dL
pH: 7.5 (ref 5.0–8.0)

## 2012-09-09 LAB — URINE MICROSCOPIC-ADD ON

## 2012-09-09 MED ORDER — ACETAMINOPHEN 325 MG PO TABS
650.0000 mg | ORAL_TABLET | ORAL | Status: DC | PRN
  Administered 2012-09-10: 650 mg via ORAL
  Filled 2012-09-09: qty 2

## 2012-09-09 MED ORDER — ALPRAZOLAM 0.25 MG PO TABS: 0.2500 mg | ORAL_TABLET | Freq: Two times a day (BID) | ORAL | Status: DC | PRN

## 2012-09-09 MED ORDER — NITROGLYCERIN 0.4 MG SL SUBL: 0.4000 mg | SUBLINGUAL_TABLET | SUBLINGUAL | Status: DC | PRN

## 2012-09-09 MED ORDER — ONDANSETRON HCL 4 MG/2ML IJ SOLN: 4.0000 mg | Freq: Four times a day (QID) | INTRAMUSCULAR | Status: DC | PRN

## 2012-09-09 MED ORDER — SODIUM CHLORIDE 0.9 % IJ SOLN: 3.0000 mL | INTRAMUSCULAR | Status: DC | PRN

## 2012-09-09 MED ORDER — SODIUM CHLORIDE 0.9 % IJ SOLN: 3.0000 mL | Freq: Two times a day (BID) | INTRAMUSCULAR | Status: DC

## 2012-09-09 MED ORDER — SODIUM CHLORIDE 0.9 % IV SOLN: 250.0000 mL | INTRAVENOUS | Status: DC | PRN

## 2012-09-09 MED ORDER — ZOLPIDEM TARTRATE 5 MG PO TABS: 5.0000 mg | ORAL_TABLET | Freq: Every evening | ORAL | Status: DC | PRN

## 2012-09-09 MED ORDER — FUROSEMIDE 10 MG/ML IJ SOLN
40.0000 mg | Freq: Once | INTRAMUSCULAR | Status: AC
  Administered 2012-09-09: 40 mg via INTRAVENOUS
  Filled 2012-09-09: qty 4

## 2012-09-09 MED ORDER — POTASSIUM CHLORIDE CRYS ER 20 MEQ PO TBCR
40.0000 meq | EXTENDED_RELEASE_TABLET | Freq: Once | ORAL | Status: AC
  Administered 2012-09-09: 40 meq via ORAL
  Filled 2012-09-09: qty 2

## 2012-09-09 MED ORDER — CIPROFLOXACIN HCL 500 MG PO TABS
500.0000 mg | ORAL_TABLET | Freq: Two times a day (BID) | ORAL | Status: DC
  Administered 2012-09-09 – 2012-09-10 (×2): 500 mg via ORAL
  Filled 2012-09-09 (×4): qty 1

## 2012-09-09 NOTE — ED Provider Notes (Addendum)
History     CSN: [id number]  Arrival date & time 09/09/12  1603   First MD Initiated Contact with Patient 09/09/12 1659      Chief Complaint  Patient presents with  . Shortness of Breath    (Consider location/radiation/quality/duration/timing/severity/associated sxs/prior treatment) The history is provided by the patient.   patient is a 26 year old female status post C-section delivery on September 24 no particular complications. Since that time she's been having gradually increasing shortness of breath and leg swelling. Today she had a outpatient chest x-ray done that was consistent with enlarged heart and bilateral pleural effusions. Her OB/GYN doctor contacted the cardiology patient was referred to the ED. Patient was treated with Cipro for a urinary tract infection a few days ago and the leg swelling started the patient was started on hydrochlorothiazide. Patient denies any chest pain she does have shortness of breath denies fevers. She still has some incisional abdominal discomfort but nothing getting worse.  Past Medical History  Diagnosis Date  . Orthostatic hypotension   . Thyroiditis     a. h/o hoshimoto's  . Asthma   . Depression   . Eczema   . PCOS (polycystic ovarian syndrome)   . Dizziness   . Gallstones     a. on actigall  . Palpitations   . Syncope     a. 03/2010 Echo NL EF, Gr 1 DD;  b. 03/2012 in setting of palpitations/pregnancy;  c. echo 04/02/12: EF 60-65%   . H/O varicella   . Hashimoto's disease   . Abnormal Pap smear   . Cholecystitis   . Chronic kidney disease     stones since a child  . PONV (postoperative nausea and vomiting)   . S/P cesarean section (9/23, FTP) 08/31/2012  . Acute blood loss anemia 08/31/2012  . Gestational thrombocytopenia 08/31/2012  . Postpartum care following cesarean delivery 08/31/2012  . Rh negative, maternal 09/02/2012    Past Surgical History  Procedure Date  . Dilation and curettage of uterus     after miscarriage 1/11    . Appendectomy   . Knee arthroscopy 2001    right  . Cesarean section 08/30/2012    Procedure: CESAREAN SECTION;  Surgeon: Lenoard Aden, MD;  Location: [facility];  Service: Obstetrics;  Laterality: N/A;    Family History  Problem Relation Age of Onset  . Hyperlipidemia Mother   . Depression Mother   . Hyperlipidemia Father   . Diabetes Paternal Grandmother     History  Substance Use Topics  . Smoking status: Never Smoker   . Smokeless tobacco: Not on file   Comment: tobacco use - no  . Alcohol Use: 0.0 oz/week    1-2 Glasses of wine per week     rare , not since pregnancy    OB History    Grav Para Term Preterm Abortions TAB SAB Ect Mult Living   3 1 1  2  2   1       Review of Systems  Constitutional: Negative for fever and chills.  HENT: Positive for neck pain.   Eyes: Negative for redness.  Respiratory: Positive for shortness of breath. Negative for chest tightness.   Cardiovascular: Positive for leg swelling. Negative for chest pain and palpitations.  Gastrointestinal: Negative for abdominal pain.  Genitourinary: Negative for dysuria.  Musculoskeletal: Negative for back pain.  Skin: Negative for rash.  Neurological: Negative for headaches.  Psychiatric/Behavioral: Negative for confusion.    Allergies  Pertussis vaccines; Sulfonamide derivatives;  Ceclor; Cranberry; Dexlansoprazole; Nitrofurantoin; and Penicillins  Home Medications   Current Outpatient Rx  Name Route Sig Dispense Refill  . ACETAMINOPHEN 325 MG PO TABS Oral Take 325 mg by mouth every 6 (six) hours as needed.    . ALBUTEROL SULFATE HFA 108 (90 BASE) MCG/ACT IN AERS Inhalation Inhale 1 puff into the lungs as directed. For shortness of breath     . BUSPIRONE HCL 5 MG PO TABS Oral Take 5 mg by mouth 2 (two) times daily as needed. For anxiety    . IBUPROFEN 600 MG PO TABS Oral Take 1 tablet (600 mg total) by mouth every 6 (six) hours. 30 tablet 1  . PRENATAL MULTIVITAMIN CH Oral Take 1 tablet by  mouth daily.    Marland Kitchen URSODIOL 250 MG PO TABS Oral Take 250 mg by mouth 2 (two) times daily as needed. For gallstones      BP 118/63  Pulse 70  Temp 98.2 F (36.8 C) (Oral)  Resp 18  SpO2 97%  LMP 12/12/2011  Breastfeeding? No  Physical Exam  Nursing note and vitals reviewed. Constitutional: She is oriented to person, place, and time. She appears well-developed and well-nourished. No distress.  HENT:  Head: Normocephalic and atraumatic.  Mouth/Throat: Oropharynx is clear and moist.  Eyes: Conjunctivae normal and EOM are normal. Pupils are equal, round, and reactive to light.  Neck: Normal range of motion. Neck supple.  Cardiovascular: Normal rate, regular rhythm and normal heart sounds.   No murmur heard. Pulmonary/Chest: Effort normal and breath sounds normal. She has no wheezes.       Questionable rales.  Abdominal: Soft. Bowel sounds are normal. There is tenderness.       Mild tenderness around the C-section incision.  Musculoskeletal: Normal range of motion. She exhibits edema.       Bilateral leg ankle and foot swelling.  Neurological: She is alert and oriented to person, place, and time. No cranial nerve deficit. She exhibits normal muscle tone. Coordination normal.  Skin: Skin is warm. No rash noted.    ED Course  Procedures (including critical care time)  Labs Reviewed  CBC WITH DIFFERENTIAL - Abnormal; Notable for the following:    RBC 3.51 (*)     Hemoglobin 9.7 (*)     HCT 29.8 (*)     All other components within normal limits  COMPREHENSIVE METABOLIC PANEL - Abnormal; Notable for the following:    Potassium 3.4 (*)     Glucose, Bld 106 (*)     Albumin 2.8 (*)     All other components within normal limits  URINALYSIS, ROUTINE W REFLEX MICROSCOPIC   Dg Chest 2 View  09/09/2012  *RADIOLOGY REPORT*  Clinical Data: Short of breath, chest tightness, cough, 1 week postpartum with C-section  CHEST - 2 VIEW  Comparison: Chest x-ray of 09/15/2009  Findings: The lungs  are not optimally aerated.  There are small bilateral pleural effusions present with bibasilar parenchymal opacities consistent with atelectasis or pneumonia.  The heart is slightly prominent and there is minimal pulmonary vascular congestion present.  No bony abnormality is seen.  IMPRESSION:  1.  Small bilateral pleural effusions with basilar atelectasis. Cannot exclude pneumonia. 2.  Cardiomegaly.  Question minimal pulmonary vascular congestion.   Original Report Authenticated By: Juline Patch, M.D.    Results for orders placed during the hospital encounter of 09/09/12  CBC WITH DIFFERENTIAL      Component Value Range   WBC 8.5  4.0 -  10.5 K/uL   RBC 3.51 (*) 3.87 - 5.11 MIL/uL   Hemoglobin 9.7 (*) 12.0 - 15.0 g/dL   HCT 82.9 (*) 56.2 - 13.0 %   MCV 84.9  78.0 - 100.0 fL   MCH 27.6  26.0 - 34.0 pg   MCHC 32.6  30.0 - 36.0 g/dL   RDW 86.5  78.4 - 69.6 %   Platelets 335  150 - 400 K/uL   Neutrophils Relative 71  43 - 77 %   Neutro Abs 6.0  1.7 - 7.7 K/uL   Lymphocytes Relative 21  12 - 46 %   Lymphs Abs 1.8  0.7 - 4.0 K/uL   Monocytes Relative 7  3 - 12 %   Monocytes Absolute 0.6  0.1 - 1.0 K/uL   Eosinophils Relative 1  0 - 5 %   Eosinophils Absolute 0.1  0.0 - 0.7 K/uL   Basophils Relative 0  0 - 1 %   Basophils Absolute 0.0  0.0 - 0.1 K/uL  COMPREHENSIVE METABOLIC PANEL      Component Value Range   Sodium 140  135 - 145 mEq/L   Potassium 3.4 (*) 3.5 - 5.1 mEq/L   Chloride 105  96 - 112 mEq/L   CO2 22  19 - 32 mEq/L   Glucose, Bld 106 (*) 70 - 99 mg/dL   BUN 8  6 - 23 mg/dL   Creatinine, Ser 2.95  0.50 - 1.10 mg/dL   Calcium 9.1  8.4 - 28.4 mg/dL   Total Protein 6.5  6.0 - 8.3 g/dL   Albumin 2.8 (*) 3.5 - 5.2 g/dL   AST 16  0 - 37 U/L   ALT 18  0 - 35 U/L   Alkaline Phosphatase 104  39 - 117 U/L   Total Bilirubin 0.3  0.3 - 1.2 mg/dL   GFR calc non Af Amer >90  >90 mL/min   GFR calc Af Amer >90  >90 mL/min    Date: 09/09/2012  Rate: 60  Rhythm: normal sinus rhythm and  sinus arrhythmia  QRS Axis: normal  Intervals: normal  ST/T Wave abnormalities: nonspecific T wave changes  Conduction Disutrbances:none  Narrative Interpretation:   Old EKG Reviewed: none available    1. Cardiomyopathy, peripartum, postpartum       MDM   The [facility] cardiology to see and admit. Patient's OB/GYN doctor had discussed the case with the [facility] cardiology earlier today. Patient in no acute distress however clinically and lab findings typically chest x-ray consistent with postpartum cardiomyopathy do not feel that this is a pneumonia there is no fever or leukocytosis. Patient is satting okay on room air blood pressure is fine systolic mostly around 118-120. Diastolics below 90.  Dr. wall from the [facility] cardiology will see and admit.        Shelda Jakes, MD 09/09/12 1818  Shelda Jakes, MD 09/12/12 2206

## 2012-09-09 NOTE — H&P (Signed)
History and Physical   Patient ID: Tracey Reyes MRN: [id number], DOB/AGE: 1986-05-15 26 y.o. Date of Encounter: 09/09/2012  Primary Physician: Johny Blamer, MD Primary Cardiologist: New - TW  Chief Complaint:  Volume overload/SOB  HPI: Tracey Reyes is a 26 year old female with no previous cardiac issues. She had a C-section on 08/31/2012 at 37 weeks and 4 days for failure to progress after being induced on 9/23 for gallbladder disease. She had significant edema at the time of discharge, but had not had edema during her pregnancy. Since d/c from the hospital on 09/03/2012, she has had only minimal improvement in her edema. She was placed on HCTZ 12.5 mg daily by her OB-GYN but has only taken it twice. It helps her edema, but she has been more SOB and describes PND and orthopnea. She is very uncomfortable from the fluid, and only weighs 4 lbs less than she did at delivery. She has had no chest pain and is otherwise doing well, recovering from the C-section. She was not able to breast-feed, but otherwise no issues with the baby, who is doing well. She has had burning with urination and was running a fever, so she took 1 day of Cipro, which she had at home, with improvement in her symptoms.  Past Medical History  Diagnosis Date  . Orthostatic hypotension   . Thyroiditis     a. h/o hoshimoto's  . Asthma   . Depression   . Eczema   . PCOS (polycystic ovarian syndrome)   . Dizziness   . Gallstones     a. on actigall  . Palpitations   . Syncope     a. 03/2010 Echo NL EF, Gr 1 DD;  b. 03/2012 in setting of palpitations/pregnancy;  c. echo 04/02/12: EF 60-65%   . H/O varicella   . Hashimoto's disease   . Abnormal Pap smear   . Cholecystitis   . Chronic kidney disease     stones since a child  . PONV (postoperative nausea and vomiting)   . S/P cesarean section (9/23, FTP) 08/31/2012  . Acute blood loss anemia 08/31/2012  . Gestational thrombocytopenia 08/31/2012  . Postpartum care  following cesarean delivery 08/31/2012  . Rh negative, maternal 09/02/2012     Surgical History:  Past Surgical History  Procedure Date  . Dilation and curettage of uterus     after miscarriage 1/11  . Appendectomy   . Knee arthroscopy 2001    right  . Cesarean section 08/30/2012    Procedure: CESAREAN SECTION;  Surgeon: Lenoard Aden, MD;  Location: [facility];  Service: Obstetrics;  Laterality: N/A;     I have reviewed the patient's current medications. Medication Sig  acetaminophen (TYLENOL) 325 MG tablet Take 325 mg by mouth every 6 (six) hours as needed.  albuterol (VENTOLIN HFA) 108 (90 BASE) MCG/ACT inhaler Inhale 1 puff into the lungs as directed. For shortness of breath  busPIRone (BUSPAR) 5 MG tablet Take 5 mg by mouth 2 times daily as needed. For anxiety  ibuprofen (ADVIL,MOTRIN) 600 MG tablet Take 1 tablet (600 mg total) by mouth every 6 (six) hours.  Prenatal Vit-Fe Fumarate-FA (PRENATAL MULTIVITAMIN) TABS Take 1 tablet by mouth daily.  ursodiol (ACTIGALL) 250 MG tablet Take 250 mg by mouth 2 (two) times daily as needed. For gallstones   Allergies:  Allergies  Allergen Reactions  . Pertussis Vaccines Other (See Comments)    Fever 103F, flu-like symptoms  . Sulfonamide Derivatives Hives  . Ceclor (  Cefaclor) Hives  . Cranberry Hives    Also blackberry and raspberrsy  . Dexlansoprazole Itching  . Nitrofurantoin Hives  . Penicillins Hives    History   Social History  . Marital Status: Married    Spouse Name: N/A    Number of Children: N/A  . Years of Education: N/A   Occupational History  . Not on file.   Social History Main Topics  . Smoking status: Never Smoker   . Smokeless tobacco: Not on file   Comment: tobacco use - no  . Alcohol Use: 0.0 oz/week    1-2 Glasses of wine per week     rare , not since pregnancy  . Drug Use: No  . Sexually Active: Not Currently   Other Topics Concern  . Not on file   Social History Narrative   Lives in [place]. 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 Graduated from [employer] 2008.Engaged.      Family History  Problem Relation Age of Onset  . Hyperlipidemia Mother   . Depression Mother   . Hyperlipidemia Father   . Diabetes Paternal Grandmother    Family Status  Relation Status Death Age  . Mother Alive   . Father Alive     gallbladder disease  . Maternal Grandfather      MI late 46s  . Maternal Uncle      mitral valve prolpase and ventricular tachycardia. has an ICD  . Brother Alive     Review of Systems: Burning with urination and fever. Abdomen is improving after C-section but still tender. No GI problems. Full 14-point review of systems otherwise negative except as noted above.   Physical Exam: Blood pressure 118/63, pulse 70, temperature 98.2 F (36.8 C), temperature source Oral, resp. rate 18, last menstrual period 12/12/2011, SpO2 97.00%, not currently breastfeeding. General: Well developed, well nourished, female in no acute distress. Head: Normocephalic, atraumatic, sclera non-icteric, no xanthomas, nares are without discharge. Dentition: good Neck: No carotid bruits. JVD slightly elevated. No thyromegally Lungs: Good expansion bilaterally. without wheezes or rhonchi. Decreased breath sounds bases with rales. Heart: Regular rate and rhythm with S1 S2.  No S3 or S4.  No murmur, no rubs, or gallops appreciated. Abdomen: Soft, diffusely tender, non-distended with normoactive bowel sounds. No hepatomegaly. No rebound/guarding. No obvious abdominal masses. Msk:  Strength and tone appear normal for age. No joint deformities or effusions, no spine or costo-vertebral angle tenderness. Extremities: No clubbing or cyanosis. 2-3+ edema.  Distal pedal pulses are 2+ in bilateral upper extrem, difficult to palpate in lower extrem secondary to edema. Neuro: Alert and oriented X 3. Moves all extremities spontaneously. No focal deficits noted. Psych:  Responds to questions appropriately  with a normal affect. Skin: No rashes or lesions noted  Labs:   Lab Results  Component Value Date   WBC 8.5 09/09/2012   HGB 9.7* 09/09/2012   HCT 29.8* 09/09/2012   MCV 84.9 09/09/2012   PLT 335 09/09/2012     Lab 09/09/12 1616  NA 140  K 3.4*  CL 105  CO2 22  BUN 8  CREATININE 0.76  CALCIUM 9.1  PROT 6.5  BILITOT 0.3  ALKPHOS 104  ALT 18  AST 16  GLUCOSE 106*   Urinalysis    Component Value Date/Time   COLORURINE YELLOW 09/09/2012 1825   APPEARANCEUR CLEAR 09/09/2012 1825   LABSPEC 1.009 09/09/2012 1825   PHURINE 7.5 09/09/2012 1825   GLUCOSEU NEGATIVE 09/09/2012 1825  HGBUR MODERATE* 09/09/2012 1825   BILIRUBINUR NEGATIVE 09/09/2012 1825   KETONESUR NEGATIVE 09/09/2012 1825   PROTEINUR NEGATIVE 09/09/2012 1825   UROBILINOGEN 1.0 09/09/2012 1825   NITRITE NEGATIVE 09/09/2012 1825   LEUKOCYTESUR NEGATIVE 09/09/2012 1825    Radiology/Studies:  Dg Chest 2 View 09/09/2012  *RADIOLOGY REPORT*  Clinical Data: Short of breath, chest tightness, cough, 1 week postpartum with C-section  CHEST - 2 VIEW  Comparison: Chest x-ray of 09/15/2009  Findings: The lungs are not optimally aerated.  There are small bilateral pleural effusions present with bibasilar parenchymal opacities consistent with atelectasis or pneumonia.  The heart is slightly prominent and there is minimal pulmonary vascular congestion present.  No bony abnormality is seen.  IMPRESSION:  1.  Small bilateral pleural effusions with basilar atelectasis. Cannot exclude pneumonia. 2.  Cardiomegaly.  Question minimal pulmonary vascular congestion.   Original Report Authenticated By: Juline Patch, M.D.    Echo:   April 2013 Study Conclusions Left ventricle: The cavity size was normal. Systolic function was normal. The estimated ejection fraction was in the range of 60% to 65%. Normal central venous pressure.  ECG: 09-Sep-2012 16:16:59   Normal sinus rhythm with sinus arrhythmia Cannot rule out Anterior infarct , age  undetermined Abnormal ECG 28mm/s 25mm/mV 100Hz  8.0.1 12SL 241 CID: 1 Referred by: Unconfirmed Vent. rate 60 BPM PR interval 138 Tracey QRS duration 76 Tracey QT/QTc 346/346 Tracey P-R-T axes 46 83 47  ASSESSMENT AND PLAN:  Principal Problem:  *Volume overload/ SOB (shortness of breath) with PND and orthopnea - Pt received substantial fluid resuscitation during her C-section due to hypotension. She left the hospital with edema which was treated by her OB/GYN, but did not improve significantly. She has only slight CE on CXR and no history of cardiomyopathy. Her BP is up slightly (for her), but this may be secondary to excess volume. Will give her 1 dose of IV Lasix now, start Lasix 20 mg daily in am and follow labs, K+, carefully. Will recheck an echocardiogram but hopefully, EF is normal. Possible d/c in am if she diureses well overnight. Although her BP is up and she has edema, her urine does not have protein and her LFTs are OK. This makes postpartum pre-eclampsia less likely, MD advise on further eval needed.  Active Problems:  UTI (lower urinary tract infection) - Continue Cipro for 3 days, ck UA and culture.  Otherwise, continue home Rx   S/P cesarean section (9/24, FTP)  Acute blood loss anemia     Signed,  Rhonda Barrett PA-C 09/09/2012, 6:38 PM   I have taken a history, reviewed medications, allergies, PMH, SH, FH, and reviewed ROS and examined the patient.  I agree with the assessment and plan.  Tracey Idris C. Daleen Squibb, MD, [facility] Pager:  [phone]

## 2012-09-09 NOTE — ED Notes (Signed)
Pt presents to department for evaluation of SOB. States she had recent C-section last Friday and has become progressively more SOB since. Was seen by PCP and had chest x-ray, referred to ED for possible admission. Respirations unlabored. Speaking complete sentences. Denies pain at the time. Swelling noted to bilateral lower extremities. She is conscious alert and oriented x4.

## 2012-09-10 DIAGNOSIS — I428 Other cardiomyopathies: Secondary | ICD-10-CM

## 2012-09-10 DIAGNOSIS — E8779 Other fluid overload: Secondary | ICD-10-CM

## 2012-09-10 LAB — COMPREHENSIVE METABOLIC PANEL
Albumin: 3 g/dL — ABNORMAL LOW (ref 3.5–5.2)
Alkaline Phosphatase: 109 U/L (ref 39–117)
BUN: 8 mg/dL (ref 6–23)
CO2: 23 mEq/L (ref 19–32)
Chloride: 103 mEq/L (ref 96–112)
Creatinine, Ser: 0.84 mg/dL (ref 0.50–1.10)
GFR calc non Af Amer: >90 mL/min (ref >90)
Glucose, Bld: 119 mg/dL — ABNORMAL HIGH (ref 70–99)
Potassium: 3.6 mEq/L (ref 3.5–5.1)
Total Bilirubin: 0.4 mg/dL (ref 0.3–1.2)

## 2012-09-10 LAB — URINE CULTURE: Colony Count: 4000

## 2012-09-10 LAB — TROPONIN I
Troponin I: <0.3 ng/mL (ref <0.30)
Troponin I: <0.3 ng/mL (ref <0.30)

## 2012-09-10 MED ORDER — POTASSIUM CHLORIDE CRYS ER 20 MEQ PO TBCR
40.0000 meq | EXTENDED_RELEASE_TABLET | Freq: Once | ORAL | Status: AC
  Administered 2012-09-10: 40 meq via ORAL
  Filled 2012-09-10: qty 1

## 2012-09-10 MED ORDER — FUROSEMIDE 20 MG PO TABS: 20.0000 mg | ORAL_TABLET | Freq: Every day | ORAL | Status: DC

## 2012-09-10 MED ORDER — FUROSEMIDE 20 MG PO TABS
20.0000 mg | ORAL_TABLET | Freq: Every day | ORAL | Status: DC
  Administered 2012-09-10: 20 mg via ORAL
  Filled 2012-09-10: qty 1

## 2012-09-10 MED ORDER — CIPROFLOXACIN HCL 500 MG PO TABS: 500.0000 mg | ORAL_TABLET | Freq: Two times a day (BID) | ORAL | Status: DC

## 2012-09-10 NOTE — Progress Notes (Signed)
Discharge instructions given. Verbalized understanding.

## 2012-09-10 NOTE — Progress Notes (Signed)
  Echocardiogram 2D Echocardiogram has been performed.  Gaius Ishaq 09/10/2012, 10:48 AM

## 2012-09-10 NOTE — Progress Notes (Signed)
PROGRESS NOTE  Subjective:   Tracey Reyes is a 26 yo with a hx of recent C-section.  She had significant hypotension during the c-section and required lots of IVF.  She presented yesterday to the ER with significant volume overload.  She was given IV Lasix and has diuresed nicely.  Her weight is down 3 lbs.  Her I/O are - 1 liter.  She is feeling much better.  Her echo from April shows normal LV function.  Objective:    Vital Signs:   Temp:  [98.2 F (36.8 C)-99.7 F (37.6 C)] 99.7 F (37.6 C) (10/04 0552) Pulse Rate:  [41-74] 66  (10/04 0552) Resp:  [16-30] 18  (10/04 0552) BP: (118-169)/(63-89) 145/82 mmHg (10/04 0552) SpO2:  [95 %-100 %] 96 % (10/04 0552) Weight:  [175 lb (79.379 kg)-178 lb (80.74 kg)] 175 lb (79.379 kg) (10/04 0552)  Last BM Date: 09/09/12   24-hour weight change: Weight change:   Weight trends: Filed Weights   09/09/12 2256 09/10/12 0552  Weight: 178 lb (80.74 kg) 175 lb (79.379 kg)    Intake/Output:  10/03 0701 - 10/04 0700 In: 240 [P.O.:240] Out: 1250 [Urine:1250]     Physical Exam: BP 145/82  Pulse 66  Temp 99.7 F (37.6 C) (Oral)  Resp 18  Ht 5' (1.524 m)  Wt 175 lb (79.379 kg)  BMI 34.18 kg/m2  SpO2 96%  LMP 12/12/2011  Breastfeeding? No  General: Vital signs reviewed and noted. Well-developed, well-nourished, in no acute distress; alert, appropriate and cooperative .  Head: Normocephalic, atraumatic.  Eyes: conjunctivae/corneas clear.  EOM's intact.   Throat: normal  Neck: Supple. Normal carotids. No JVD  Lungs:  Clear  Heart: Regular rate,  With normal  S1 S2. No murmurs, gallops or rubs  Abdomen:  Soft, non-tender, non-distended with normoactive bowel sounds. No hepatomegaly. No rebound/guarding. No abdominal masses.  Extremities: Distal pedal pulses are 2+ .  No significant edema.    Neurologic: A&O X3, CN II - XII are grossly intact. Motor strength is 5/5 in the all 4 extremities.  Psych: Responds to questions appropriately  with normal affect.    Labs: BMET:  Basename 09/09/12 1616  NA 140  K 3.4*  CL 105  CO2 22  GLUCOSE 106*  BUN 8  CREATININE 0.76  CALCIUM 9.1  MG --  PHOS --    Liver function tests:  Basename 09/09/12 1616  AST 16  ALT 18  ALKPHOS 104  BILITOT 0.3  PROT 6.5  ALBUMIN 2.8*   No results found for this basename: LIPASE:2,AMYLASE:2 in the last 72 hours  CBC:  Basename 09/09/12 1616  WBC 8.5  NEUTROABS 6.0  HGB 9.7*  HCT 29.8*  MCV 84.9  PLT 335    Cardiac Enzymes:  Basename 09/09/12 2358  CKTOTAL --  CKMB --  TROPONINI <0.30      Tele:  NSR  Medications:    Infusions:    Scheduled Medications:    . ciprofloxacin  500 mg Oral BID  . furosemide  40 mg Intravenous Once  . potassium chloride  40 mEq Oral Once  . sodium chloride  3 mL Intravenous Q12H    Assessment/ Plan:     Volume overload (09/09/2012) Better this am.  Will change lasix to PO. 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  S/P cesarean section (9/24, FTP) (08/31/2012)   Doing well.  Acute blood loss anemia (08/31/2012) Stable   Disposition: home today if echo is ok.  Length of Stay: 1  Vesta Mixer, Montez Hageman., MD, [facility] 09/10/2012, 7:49 AM Office [phone] Pager [phone]

## 2012-09-10 NOTE — Discharge Summary (Signed)
CARDIOLOGY DISCHARGE SUMMARY   Patient ID: Tracey Reyes MRN: [id number] DOB/AGE: [birth year] 26 y.o.  Admit date: 09/09/2012 Discharge date: 09/10/2012  Primary Discharge Diagnosis:  *Volume overload *  Secondary Discharge Diagnosis:   SOB (shortness of breath) with PND and orthopnea  UTI (lower urinary tract infection)  S/P cesarean section (9/24, FTP)  Acute blood loss anemia  Procedures: 2-D echocardiogram  Hospital Course: Tracey Reyes is a 26 year old female with a history of syncope and a normal echocardiogram who had a C-section on 08/31/2012. She had edema at discharge which did not improve with HCTZ. She developed shortness of breath and came to the hospital where she was admitted for further evaluation and treatment.  Her BNP was elevated. She was given a dose of IV Lasix and responded well to this. Her potassium required supplementation this was done. She was anemic but was asymptomatic from this. She has a history of syncope but had no problems during this hospitalization. She had no arrhythmias, other than sinus bradycardia.  She had a 2-D echocardiogram. It showed a pleural effusion, but her EF was normal and no heart failure was seen.   On 09/10/2012, Tracey Reyes felt her respiratory status was much improved. She still had some lower extremity edema, but it was felt this could be treated with oral diuretics as an outpatient. She was evaluated by Dr. Elease Hashimoto and considered stable for discharge, to follow up as an outpatient with Dr. Elease Hashimoto.  Labs:   Lab Results  Component Value Date   WBC 8.5 09/09/2012   HGB 9.7* 09/09/2012   HCT 29.8* 09/09/2012   MCV 84.9 09/09/2012   PLT 335 09/09/2012    Lab 09/10/12 0859  NA 139  K 3.6  CL 103  CO2 23  BUN 8  CREATININE 0.84  CALCIUM 9.4  PROT 7.0  BILITOT 0.4  ALKPHOS 109  ALT 19  AST 21  GLUCOSE 119*    Basename 09/10/12 0859 09/09/12 2358  CKTOTAL -- --  CKMB -- --  CKMBINDEX -- --  TROPONINI <0.30 <0.30     Pro B Natriuretic peptide (BNP)  Date/Time Value Range Status  09/09/2012 11:58 PM 1327.0* 0 - 125 pg/mL Final       Radiology: Dg Chest 2 View  09/09/2012  *RADIOLOGY REPORT*  Clinical Data: Short of breath, chest tightness, cough, 1 week postpartum with C-section  CHEST - 2 VIEW  Comparison: Chest x-ray of 09/15/2009  Findings: The lungs are not optimally aerated.  There are small bilateral pleural effusions present with bibasilar parenchymal opacities consistent with atelectasis or pneumonia.  The heart is slightly prominent and there is minimal pulmonary vascular congestion present.  No bony abnormality is seen.  IMPRESSION:  1.  Small bilateral pleural effusions with basilar atelectasis. Cannot exclude pneumonia. 2.  Cardiomegaly.  Question minimal pulmonary vascular congestion.   Original Report Authenticated By: Juline Patch, M.D.    EKG: 09-Sep-2012 16:16:59 [facility]/ED ROUTINE RECORD Normal sinus rhythm with sinus arrhythmia Cannot rule out Anterior infarct , age undetermined Abnormal ECG 36mm/s 83mm/mV 100Hz  8.0.1 12SL 241 CID: 1 Referred by: Unconfirmed Vent. rate 60 BPM PR interval 138 ms QRS duration 76 ms QT/QTc 346/346 ms P-R-T axes 46 83 47  Echo: 09/10/2012 Study Conclusions - Left ventricle: The cavity size was normal. Wall thickness was normal. Systolic function was normal. The estimated ejection fraction was in the range of 55% to 60%. - Pericardium, extracardiac: A trivial pericardial effusion was identified.  There was a left pleural effusion.  FOLLOW UP PLANS AND APPOINTMENTS Allergies  Allergen Reactions  . Pertussis Vaccines Other (See Comments)    Fever 103F, flu-like symptoms  . Sulfonamide Derivatives Hives  . Ceclor (Cefaclor) Hives  . Cranberry Hives    Also blackberry and raspberrsy  . Dexlansoprazole Itching  . Nitrofurantoin Hives  . Penicillins Hives     Medication List     As of 09/10/2012  1:36 PM    TAKE these  medications         acetaminophen 325 MG tablet   Commonly known as: TYLENOL   Take 325 mg by mouth every 6 (six) hours as needed.      busPIRone 5 MG tablet   Commonly known as: BUSPAR   Take 5 mg by mouth 2 (two) times daily as needed. For anxiety      ciprofloxacin 500 MG tablet   Commonly known as: CIPRO   Take 1 tablet (500 mg total) by mouth 2 (two) times daily.      furosemide 20 MG tablet   Commonly known as: LASIX   Take 1 tablet (20 mg total) by mouth daily.      ibuprofen 600 MG tablet   Commonly known as: ADVIL,MOTRIN   Take 1 tablet (600 mg total) by mouth every 6 (six) hours.      prenatal multivitamin Tabs   Take 1 tablet by mouth daily.      ursodiol 250 MG tablet   Commonly known as: ACTIGALL   Take 250 mg by mouth 2 (two) times daily as needed. For gallstones      VENTOLIN HFA 108 (90 BASE) MCG/ACT inhaler   Generic drug: albuterol   Inhale 1 puff into the lungs as directed. For shortness of breath              Discharge Orders    Future Appointments: Provider: Department: Dept Phone: Center:   09/15/2012 1:30 PM Dyann Kief, PA [facility] [phone] [id number]     Follow-up Information    Follow up with Jacolyn Reedy, PA. On 09/15/2012. (1:30 pm)    Contact information:   [address] [place], [address] [place] [address] [phone]          BRING ALL MEDICATIONS WITH YOU TO FOLLOW UP APPOINTMENTS  Time spent with patient to include physician time: 33 min Signed: Theodore Demark 09/10/2012, 1:36 PM Co-Sign MD  See my note from earlier today. Pt is stable and ready to go home.   Alvia Grove MD, [facility]

## 2012-09-10 NOTE — Progress Notes (Signed)
Patient had episode of bradycardia to 36 with 2.24 second pause.  Patient c/o chills at this time with no other symptoms.  Vital signs obtained: 144/89 blood pressure, HR increased to 54, 95% on RA, and 98.7 oral temp.  RN advised patient to use rest room and assess for access bleeding.  Patient declined increase in vaginal bleeding s/p c-section.  MD on call made aware and no new orders placed. Patient currently resting at bedside in no acute distress. RN will continue to monitor.

## 2012-09-15 ENCOUNTER — Ambulatory Visit (INDEPENDENT_AMBULATORY_CARE_PROVIDER_SITE_OTHER): Payer: BC Managed Care – PPO | Admitting: Physician Assistant

## 2012-09-15 ENCOUNTER — Encounter: Payer: Self-pay | Admitting: Physician Assistant

## 2012-09-15 VITALS — BP 98/68 | HR 70 | Ht 63.0 in | Wt 167.0 lb

## 2012-09-15 DIAGNOSIS — E8770 Fluid overload, unspecified: Secondary | ICD-10-CM

## 2012-09-15 DIAGNOSIS — E8779 Other fluid overload: Secondary | ICD-10-CM

## 2012-09-15 DIAGNOSIS — I951 Orthostatic hypotension: Secondary | ICD-10-CM

## 2012-09-15 NOTE — Assessment & Plan Note (Signed)
No symptoms of orthostatic hypotension at this time.

## 2012-09-15 NOTE — Progress Notes (Signed)
HPI:   This is a 26 year old white female patient who was seen throughout her pregnancy with orthostatic hypotension, PCO S., and Hashimoto's thyroiditis. Her echo in April 2013 ejection fraction was 60-65%. She had a C-section during delivery and received the lot of fluids and developed volume overload. She was in the hospital and diuresed. Repeat 2-D echo showed normal LV function ejection fraction 60-65% with normal central venous pressure. Chest x-ray did show cardiomegaly. EKG was normal sinus rhythm. She was sent home on low dose Lasix 20 mg daily and is here for followup.  The patient has lost 25 pounds since discharge and is feeling almost 100% better. She denies any dyspnea, dyspnea on exertion, or orthopnea.   Allergies -- Pertussis Vaccines -- Other (See Comments)   --  Fever 103F, flu-like symptoms  -- Sulfonamide Derivatives -- Hives  -- Ceclor (Cefaclor) -- Hives  -- Cranberry -- Hives   --  Also blackberry and raspberrsy  -- Dexlansoprazole -- Itching  -- Nitrofurantoin -- Hives  -- Penicillins -- Hives  Current Outpatient Prescriptions on File Prior to Visit: albuterol (VENTOLIN HFA) 108 (90 BASE) MCG/ACT inhaler, Inhale 1 puff into the lungs as directed. For shortness of breath, Disp: , Rfl:  busPIRone (BUSPAR) 5 MG tablet, Take 5 mg by mouth 2 (two) times daily as needed. For anxiety, Disp: , Rfl:  Prenatal Vit-Fe Fumarate-FA (PRENATAL MULTIVITAMIN) TABS, Take 1 tablet by mouth daily., Disp: , Rfl:  ursodiol (ACTIGALL) 250 MG tablet, Take 250 mg by mouth 2 (two) times daily as needed. For gallstones, Disp: , Rfl:     Past Medical History:   Orthostatic hypotension                                      Thyroiditis                                                    Comment:a. h/o hoshimoto's   Asthma                                                       Depression                                                   Eczema                                                         PCOS (polycystic ovarian syndrome)                           Dizziness  Gallstones                                                     Comment:a. on actigall   Palpitations                                                 Syncope                                                        Comment:a. 03/2010 Echo NL EF, Gr 1 DD;  b. 03/2012 in               setting of palpitations/pregnancy;  c. echo               04/02/12: EF 60-65%    H/O varicella                                                Hashimoto's disease                                          Abnormal Pap smear                                           Cholecystitis                                                Chronic kidney disease                                         Comment:stones since a child   PONV (postoperative nausea and vomiting)                     S/P cesarean section (9/23, FTP)                08/31/2012    Acute blood loss anemia                         08/31/2012    Gestational thrombocytopenia                    08/31/2012    Postpartum care following cesarean delivery     08/31/2012    Rh negative, maternal                           09/02/2012  Past Surgical History:   DILATION AND CURETTAGE OF UTERUS                               Comment:after miscarriage 1/11   APPENDECTOMY                                                 KNEE ARTHROSCOPY                                2001           Comment:right   CESAREAN SECTION                                08/30/2012      Comment:Procedure: CESAREAN SECTION;  Surgeon: Lenoard Aden, MD;  Location: [facility];  Service:               Obstetrics;  Laterality: N/A;  Review of patient's family history indicates:   Hyperlipidemia                 Mother                   Depression                     Mother                   Hyperlipidemia                 Father                   Diabetes                        Paternal Grandmother     Social History   Marital Status: Married             Spouse Name:                      Years of Education:                 Number of children:             Occupational History   None on file  Social History Main Topics   Smoking Status: Never Smoker                     Smokeless Status: Not on file                      Comment: tobacco use - no   Alcohol Use: Yes           0.0 oz/week      1-2 Glasses of wine per week      Comment: rare , not since pregnancy   Drug Use: No             Sexual Activity: Not Currently      Other Topics  Concern   None on file  Social History Narrative   Lives in [place]. 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    Graduated from [employer] 2008.   Engaged.     ROS: See history of present illness otherwise negative  PHYSICAL EXAM: Well-nournished, in no acute distress. Neck: No JVD, HJR, Bruit, or thyroid enlargement  Lungs: No tachypnea, clear without wheezing, rales, or rhonchi  Cardiovascular: RRR, PMI not displaced, heart sounds normal, no murmurs, gallops, bruit, thrill, or heave.  Abdomen: BS normal. Soft without organomegaly, masses, lesions or tenderness.  Extremities: without cyanosis, clubbing or edema. Good distal pulses bilateral  SKin: Warm, no lesions or rashes   Musculoskeletal: No deformities  Neuro: no focal signs  BP 98/68  Pulse 70  Ht 5\' 3"  (1.6 m)  Wt 167 lb (75.751 kg)  BMI 29.58 kg/m2  LMP 12/12/2011   ZOX:WRUEAV sinus rhythm with nonspecific ST-T wave changes no acute change

## 2012-09-15 NOTE — Patient Instructions (Signed)
Your physician has recommended you make the following change in your medication: stop taking Lasix (Furosemide)  Your physician recommends that you schedule a follow-up appointment in: 2 months

## 2012-09-15 NOTE — Assessment & Plan Note (Signed)
Patient's volume overload postpartum has resolved. We'll stop her Lasix today. She is to call she has any further edema or shortness of breath. She will see Dr. Already back in 2 months.

## 2012-09-28 NOTE — Discharge Summary (Signed)
Obstetric Discharge Summary Reason for Admission: Planned C/S @ 37 wks d/t hx biliary colic during pregnancy Prenatal Procedures: NST and ultrasound Intrapartum Procedures: cesarean: low cervical, transverse Postpartum Procedures: none Complications-Operative and Postpartum: none Hemoglobin  Date Value Range Status  09/09/2012 9.7* 12.0 - 15.0 g/dL Final     HCT  Date Value Range Status  09/09/2012 29.8* 36.0 - 46.0 % Final    Physical Exam:  General: alert, cooperative and no distress Lochia: appropriate Uterine Fundus: firm Incision: healing well DVT Evaluation: No evidence of DVT seen on physical exam.  Discharge Diagnoses: C/S at 37 wks with biliary colic  Discharge Information: Date: 09/03/12 Activity: pelvic rest Diet: routine Medications: PNV, Ibuprofen and Percocet Condition: stable Instructions: refer to practice specific booklet Discharge to: home   Newborn Data: Live born female on 08/31/12 Birth Weight: 6 lb 11.2 oz (3040 g) APGAR: 8, 9  Home with mother.  Tracey Reyes 09/03/12

## 2012-10-07 ENCOUNTER — Emergency Department (HOSPITAL_COMMUNITY)
Admission: EM | Admit: 2012-10-07 | Discharge: 2012-10-07 | Disposition: A | Discharge status: Home / Self Care | Payer: BC Managed Care – PPO | Attending: Emergency Medicine | Admitting: Emergency Medicine

## 2012-10-07 DIAGNOSIS — E282 Polycystic ovarian syndrome: Secondary | ICD-10-CM | POA: Insufficient documentation

## 2012-10-07 DIAGNOSIS — K429 Umbilical hernia without obstruction or gangrene: Secondary | ICD-10-CM | POA: Insufficient documentation

## 2012-10-07 DIAGNOSIS — J45909 Unspecified asthma, uncomplicated: Secondary | ICD-10-CM | POA: Insufficient documentation

## 2012-10-07 DIAGNOSIS — Z872 Personal history of diseases of the skin and subcutaneous tissue: Secondary | ICD-10-CM | POA: Insufficient documentation

## 2012-10-07 DIAGNOSIS — Z8619 Personal history of other infectious and parasitic diseases: Secondary | ICD-10-CM | POA: Insufficient documentation

## 2012-10-07 DIAGNOSIS — E063 Autoimmune thyroiditis: Secondary | ICD-10-CM | POA: Insufficient documentation

## 2012-10-07 DIAGNOSIS — Z9889 Other specified postprocedural states: Secondary | ICD-10-CM | POA: Insufficient documentation

## 2012-10-07 DIAGNOSIS — I951 Orthostatic hypotension: Secondary | ICD-10-CM | POA: Insufficient documentation

## 2012-10-07 DIAGNOSIS — F329 Major depressive disorder, single episode, unspecified: Secondary | ICD-10-CM | POA: Insufficient documentation

## 2012-10-07 DIAGNOSIS — F3289 Other specified depressive episodes: Secondary | ICD-10-CM | POA: Insufficient documentation

## 2012-10-07 DIAGNOSIS — Z9089 Acquired absence of other organs: Secondary | ICD-10-CM | POA: Insufficient documentation

## 2012-10-07 DIAGNOSIS — Z79899 Other long term (current) drug therapy: Secondary | ICD-10-CM | POA: Insufficient documentation

## 2012-10-07 DIAGNOSIS — N189 Chronic kidney disease, unspecified: Secondary | ICD-10-CM | POA: Insufficient documentation

## 2012-10-07 NOTE — ED Notes (Signed)
Urine cancelled

## 2012-10-07 NOTE — ED Notes (Signed)
Pt presents to department for evaluation of possible umbilical hernia. States recent birth 08/31/12. Noticed mass near umbilicus shortly after, states raised painful area. States she was able to get hernia to "go back in" today, but is concerned due to pain and discomfort. States she has upcoming appointment with [facility]. Denies pain at the time. Pt is alert and oriented x4.

## 2012-10-07 NOTE — ED Provider Notes (Signed)
History     CSN: [id number]  Arrival date & time 10/07/12  1448   First MD Initiated Contact with Patient 10/07/12 1542      Chief Complaint  Patient presents with  . Hernia    (Consider location/radiation/quality/duration/timing/severity/associated sxs/prior treatment) The history is provided by the patient.   36, old female, presents emergency department complaining of pain in her umbilical area.  Since she was pregnant.  She has had an umbilical hernia.  He was causing severe pain.  Today.  Initially, she was unable to reduce the hernia, so, she came to the emergency department.  She has a scheduled appointment with the general surgeons.  Upon arrival, she is pain free.  She has not had vomiting.  She is asymptomatic now.  Past Medical History  Diagnosis Date  . Orthostatic hypotension   . Thyroiditis     a. h/o hoshimoto's  . Asthma   . Depression   . Eczema   . PCOS (polycystic ovarian syndrome)   . Dizziness   . Gallstones     a. on actigall  . Palpitations   . Syncope     a. 03/2010 Echo NL EF, Gr 1 DD;  b. 03/2012 in setting of palpitations/pregnancy;  c. echo 04/02/12: EF 60-65%   . H/O varicella   . Hashimoto's disease   . Abnormal Pap smear   . Cholecystitis   . Chronic kidney disease     stones since a child  . PONV (postoperative nausea and vomiting)   . S/P cesarean section (9/23, FTP) 08/31/2012  . Acute blood loss anemia 08/31/2012  . Gestational thrombocytopenia 08/31/2012  . Postpartum care following cesarean delivery 08/31/2012  . Rh negative, maternal 09/02/2012    Past Surgical History  Procedure Date  . Dilation and curettage of uterus     after miscarriage 1/11  . Appendectomy   . Knee arthroscopy 2001    right  . Cesarean section 08/30/2012    Procedure: CESAREAN SECTION;  Surgeon: Lenoard Aden, MD;  Location: [facility];  Service: Obstetrics;  Laterality: N/A;    Family History  Problem Relation Age of Onset  . Hyperlipidemia Mother   .  Depression Mother   . Hyperlipidemia Father   . Diabetes Paternal Grandmother     History  Substance Use Topics  . Smoking status: Never Smoker   . Smokeless tobacco: Not on file   Comment: tobacco use - no  . Alcohol Use: 0.0 oz/week    1-2 Glasses of wine per week     rare , not since pregnancy    OB History    Grav Para Term Preterm Abortions TAB SAB Ect Mult Living   3 1 1  2  2   1       Review of Systems  Gastrointestinal: Positive for abdominal pain. Negative for nausea and vomiting.  All other systems reviewed and are negative.    Allergies  Pertussis vaccines; Sulfonamide derivatives; Ceclor; Cranberry; Dexlansoprazole; Nitrofurantoin; and Penicillins  Home Medications   Current Outpatient Rx  Name Route Sig Dispense Refill  . ALBUTEROL SULFATE HFA 108 (90 BASE) MCG/ACT IN AERS Inhalation Inhale 1 puff into the lungs every 6 (six) hours as needed. For shortness of breath     . BUSPIRONE HCL 5 MG PO TABS Oral Take 5 mg by mouth 2 (two) times daily as needed. For anxiety    . URSODIOL 250 MG PO TABS Oral Take 250 mg by mouth 2 (two)  times daily as needed. For gallstones      BP 121/82  Pulse 82  Temp 98.3 F (36.8 C) (Oral)  Resp 18  SpO2 100%  Physical Exam  Constitutional: She is oriented to person, place, and time. She appears well-developed and well-nourished. No distress.  HENT:  Head: Normocephalic and atraumatic.  Eyes: Conjunctivae normal are normal.  Neck: Normal range of motion.  Pulmonary/Chest: Effort normal.  Abdominal: Soft. She exhibits no distension. There is no tenderness. There is no guarding.       No hernia present at this time.  Musculoskeletal: Normal range of motion.  Neurological: She is alert and oriented to person, place, and time.  Skin: Skin is warm and dry.  Psychiatric: She has a normal mood and affect. Thought content normal.    ED Course  Procedures (including critical care time)   Labs Reviewed  URINALYSIS,  ROUTINE W REFLEX MICROSCOPIC   No results found.   1. Umbilical hernia       MDM   Umbilical hernia - reduced. asx now        Cheri Guppy, MD 10/07/12 [phone]

## 2012-10-08 ENCOUNTER — Encounter (INDEPENDENT_AMBULATORY_CARE_PROVIDER_SITE_OTHER): Payer: BC Managed Care – PPO | Admitting: Surgery

## 2012-10-08 ENCOUNTER — Ambulatory Visit (INDEPENDENT_AMBULATORY_CARE_PROVIDER_SITE_OTHER): Payer: BC Managed Care – PPO | Admitting: Surgery

## 2012-10-08 ENCOUNTER — Encounter (INDEPENDENT_AMBULATORY_CARE_PROVIDER_SITE_OTHER): Payer: Self-pay | Admitting: Surgery

## 2012-10-08 VITALS — BP 102/60 | HR 68 | Temp 99.0°F | Resp 18 | Ht 63.0 in | Wt 165.2 lb

## 2012-10-08 DIAGNOSIS — K829 Disease of gallbladder, unspecified: Secondary | ICD-10-CM

## 2012-10-08 DIAGNOSIS — K429 Umbilical hernia without obstruction or gangrene: Secondary | ICD-10-CM

## 2012-10-08 NOTE — Progress Notes (Addendum)
[facility]  Ovidio Kin, MD,  FACS [address] [place]  [address] [place], [place]    [address] Phone:  [phone] FAX:  [phone]   Re:   Tracey Reyes DOB:   21-Oct-1986 MRN:   [id number]  ASSESSMENT AND PLAN: 1. Symptomatic gall bladder disease.   She has taken actigall (300 mg BID) to controll her symptoms.  She had a lot of trouble at the end of her pregnancy, but since delivering the baby, she has had no gall bladder symptoms.  Her problems have all been with her umbilical hernia.  His [place] on 12/23/2011 showed 4 stones.  By measurement, the stone was 10 mm in August 2012 and 9 mm on the current study.  I gave her a copy of the [place].  So the stones are not bigger, but I am not sure how much smaller they really are.  We again talked about her gall bladder and the option of surgery.  I gave her another bookd on gall bladder disease.  She wants to try to schedule surgery for both the umbilical hernia and gall bladder in December 2013.  We will try to do this as an outpatient.  [from [name]: "[name] called to report that pt would only sign consent form for the umbilical hernia repair and not for the laparoscopic cholecystectomy because she did not wish to be,out of work for an extended period of time. She wants gb surgery later. Her surgery is 11-25-12."   I tried to call her, but only got her answering machine. DN 11/22/2012]  2. PCOS  3. History of multiple UTI's.  4. Multiple allergies.  5. Asthma.  6. Anxiety. 7.  She had a son Tracey Reyes - on 08/31/2012 by C-section.  She sees Dr. Billy Coast.  She had some fluid overload post delivery, which required a readmission to the hospital.  8.  Umbilical hernia  I discussed the indications and complications of hernia surgery with the patient.  I discussed both the laparoscopic and open approach to hernia repair..  The potential risks of hernia surgery include, but are not limited to, bleeding, infection, open surgery,  nerve injury, and recurrence of the hernia.  I provided the patient literature about hernia surgery. 9.  Fluid overload post pregnancy  Saw Dr. Algis Greenhouse in the hospital, to see Dr. Johney Frame in December.  Otherwise no further cardiac issues.   HISTORY OF PRESENT ILLNESS: Chief Complaint  Patient presents with  . Establish Care    hernia    Tracey Reyes is a 26 y.o. (DOB: 1986/11/24)  white female who is a patient of HARRIS, Chrissie Noa, MD and comes to me today for follow up of gall stones and gall bladder disease and newer symptoms of an umbilical hernia.  About 3 weeks ago she developed "spasms" in her abdomen. She noticed a bulge to the right of her umbilicus. She was able to reduce this hernia until yesterday. Because the hernia remained sticking out, she went to the [facility] ER.. They were able to reduce her umbilical hernia in the ER and then she has come to our office today.  Her gall bladder bothered her some at the end of her pregnancy, but none since she delivered hier son.  Review of System: GI:  Had lap appendectomy by Dr. Derrell Lolling - 10/21/2010. Urologic: No history of kidney stones. Has had multiple UTI's. Has seen Dr. Aldean Ast since age 26.  GYN: Miscarried 2 years ago. Diagnosed with  PCOS by Dr. [name] ([facility]).  Social: Married. Has sone Tracey Reyes, just born. 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 Abdomen:  Soft.  BS positive. No mass or tenderness.  In both a supine and standing position, I do not feel an umbilical hernia.  DATA REVIEWED: Epic and recent labs.  Ovidio Kin, MD, FACS Office:  [phone]

## 2012-11-07 ENCOUNTER — Emergency Department (HOSPITAL_COMMUNITY): Payer: BC Managed Care – PPO

## 2012-11-07 ENCOUNTER — Encounter (HOSPITAL_COMMUNITY): Payer: Self-pay

## 2012-11-07 ENCOUNTER — Emergency Department (HOSPITAL_COMMUNITY)
Admission: EM | Admit: 2012-11-07 | Discharge: 2012-11-07 | Disposition: A | Discharge status: Home / Self Care | Payer: BC Managed Care – PPO | Attending: Emergency Medicine | Admitting: Emergency Medicine

## 2012-11-07 DIAGNOSIS — K429 Umbilical hernia without obstruction or gangrene: Secondary | ICD-10-CM

## 2012-11-07 DIAGNOSIS — Z862 Personal history of diseases of the blood and blood-forming organs and certain disorders involving the immune mechanism: Secondary | ICD-10-CM | POA: Insufficient documentation

## 2012-11-07 DIAGNOSIS — Z8719 Personal history of other diseases of the digestive system: Secondary | ICD-10-CM | POA: Insufficient documentation

## 2012-11-07 DIAGNOSIS — Z872 Personal history of diseases of the skin and subcutaneous tissue: Secondary | ICD-10-CM | POA: Insufficient documentation

## 2012-11-07 DIAGNOSIS — Z8619 Personal history of other infectious and parasitic diseases: Secondary | ICD-10-CM | POA: Insufficient documentation

## 2012-11-07 DIAGNOSIS — J45909 Unspecified asthma, uncomplicated: Secondary | ICD-10-CM | POA: Insufficient documentation

## 2012-11-07 DIAGNOSIS — Z9889 Other specified postprocedural states: Secondary | ICD-10-CM | POA: Insufficient documentation

## 2012-11-07 DIAGNOSIS — Z8659 Personal history of other mental and behavioral disorders: Secondary | ICD-10-CM | POA: Insufficient documentation

## 2012-11-07 DIAGNOSIS — N189 Chronic kidney disease, unspecified: Secondary | ICD-10-CM | POA: Insufficient documentation

## 2012-11-07 DIAGNOSIS — I951 Orthostatic hypotension: Secondary | ICD-10-CM | POA: Insufficient documentation

## 2012-11-07 DIAGNOSIS — E063 Autoimmune thyroiditis: Secondary | ICD-10-CM | POA: Insufficient documentation

## 2012-11-07 DIAGNOSIS — K8020 Calculus of gallbladder without cholecystitis without obstruction: Secondary | ICD-10-CM | POA: Insufficient documentation

## 2012-11-07 DIAGNOSIS — Z79899 Other long term (current) drug therapy: Secondary | ICD-10-CM | POA: Insufficient documentation

## 2012-11-07 NOTE — ED Provider Notes (Signed)
History     CSN: [id number]  Arrival date & time 11/07/12  1558   First MD Initiated Contact with Patient 11/07/12 1629      Chief Complaint  Patient presents with  . Abdominal Pain    (Consider location/radiation/quality/duration/timing/severity/associated sxs/prior treatment) Patient is a 26 y.o. female presenting with abdominal pain. The history is provided by the patient and the spouse.  Abdominal Pain The primary symptoms of the illness include abdominal pain.   patient here with periumbilical abdominal pain that started today. History of umbilical hernia and is scheduled for repair and 3 weeks. Has been having worsening symptoms for the last 3-4 days for which she has been treating by manually reducing her hernia. Today the symptoms became more severe and she had vomited times one. No fever or chills. Symptoms worse with squatting down or increasing her intra-abdominal pressure and better with manual reduction  Past Medical History  Diagnosis Date  . Orthostatic hypotension   . Thyroiditis     a. h/o hoshimoto's  . Asthma   . Depression   . Eczema   . PCOS (polycystic ovarian syndrome)   . Dizziness   . Gallstones     a. on actigall  . Palpitations   . Syncope     a. 03/2010 Echo NL EF, Gr 1 DD;  b. 03/2012 in setting of palpitations/pregnancy;  c. echo 04/02/12: EF 60-65%   . H/O varicella   . Hashimoto's disease   . Abnormal Pap smear   . Cholecystitis   . Chronic kidney disease     stones since a child  . PONV (postoperative nausea and vomiting)   . S/P cesarean section (9/23, FTP) 08/31/2012  . Acute blood loss anemia 08/31/2012  . Gestational thrombocytopenia 08/31/2012  . Postpartum care following cesarean delivery 08/31/2012  . Rh negative, maternal 09/02/2012    Past Surgical History  Procedure Date  . Dilation and curettage of uterus     after miscarriage 1/11  . Appendectomy   . Knee arthroscopy 2001    right  . Cesarean section 08/30/2012    Procedure:  CESAREAN SECTION;  Surgeon: Lenoard Aden, MD;  Location: [facility];  Service: Obstetrics;  Laterality: N/A;    Family History  Problem Relation Age of Onset  . Hyperlipidemia Mother   . Depression Mother   . Hyperlipidemia Father   . Diabetes Paternal Grandmother     History  Substance Use Topics  . Smoking status: Never Smoker   . Smokeless tobacco: Not on file     Comment: tobacco use - no  . Alcohol Use: 0.0 oz/week    1-2 Glasses of wine per week     Comment: rare , not since pregnancy    OB History    Grav Para Term Preterm Abortions TAB SAB Ect Mult Living   3 1 1  2  2   1       Review of Systems  Gastrointestinal: Positive for abdominal pain.  All other systems reviewed and are negative.    Allergies  Pertussis vaccines; Sulfonamide derivatives; Ceclor; Cranberry; Dexlansoprazole; Nitrofurantoin; and Penicillins  Home Medications   Current Outpatient Rx  Name  Route  Sig  Dispense  Refill  . ALBUTEROL SULFATE HFA 108 (90 BASE) MCG/ACT IN AERS   Inhalation   Inhale 1 puff into the lungs every 6 (six) hours as needed. For shortness of breath          . BUSPIRONE HCL 5 MG  PO TABS   Oral   Take 5 mg by mouth 2 (two) times daily as needed. For anxiety         . URSODIOL 250 MG PO TABS   Oral   Take 250 mg by mouth 2 (two) times daily as needed. For gallstones           BP 127/79  Pulse 68  Temp 98.7 F (37.1 C) (Oral)  Resp 21  SpO2 100%  Physical Exam  Nursing note and vitals reviewed. Constitutional: She is oriented to person, place, and time. She appears well-developed and well-nourished.  Non-toxic appearance. No distress.  HENT:  Head: Normocephalic and atraumatic.  Eyes: Conjunctivae normal, EOM and lids are normal. Pupils are equal, round, and reactive to light.  Neck: Normal range of motion. Neck supple. No tracheal deviation present. No mass present.  Cardiovascular: Normal rate, regular rhythm and normal heart sounds.  Exam  reveals no gallop.   No murmur heard. Pulmonary/Chest: Effort normal and breath sounds normal. No stridor. No respiratory distress. She has no decreased breath sounds. She has no wheezes. She has no rhonchi. She has no rales.  Abdominal: Soft. Normal appearance and bowel sounds are normal. She exhibits no distension. There is tenderness in the periumbilical area. There is no rigidity, no rebound, no guarding and no CVA tenderness.    Musculoskeletal: Normal range of motion. She exhibits no edema and no tenderness.  Neurological: She is alert and oriented to person, place, and time. She has normal strength. No cranial nerve deficit or sensory deficit. GCS eye subscore is 4. GCS verbal subscore is 5. GCS motor subscore is 6.  Skin: Skin is warm and dry. No abrasion and no rash noted.  Psychiatric: She has a normal mood and affect. Her speech is normal and behavior is normal.    ED Course  Procedures (including critical care time)  Labs Reviewed - No data to display No results found.   No diagnosis found.    MDM  Spoke with dr. Biagio Quint, from general surgery, he advises that the patient call the office tomorrow. Patient has no signs of incarcerated hernia at this time. Her acute abdominal series was negative for obstruction. Her abdomen is nonsurgical at time of discharge. Dr. Biagio Quint also states that he will contact Dr. Allene Pyo nurse and she will call the pt        Toy Baker, MD 11/07/12 [phone]

## 2012-11-07 NOTE — ED Notes (Signed)
She c/o known umbilical hernia, issues with which she has had for about two months.  She states her doctor has reduced the hernia before, and taught her to do it, which has been successful until today.  She states she has had much pain at umbilicus, and has been unable to reduce the hernia.  She is tearful as if in much pain.  She denies fever/n/v/d.

## 2012-11-08 ENCOUNTER — Telehealth (INDEPENDENT_AMBULATORY_CARE_PROVIDER_SITE_OTHER): Payer: Self-pay

## 2012-11-08 NOTE — Telephone Encounter (Signed)
Spoke with patient she states she went to the ER last night due to her Umb.  Hernia The doctor pushed the hernia back in. Patient wants to cancel her sx 11/25/2012( umb hernia / gallbladder ) due to she can not get off work . She needs it on a Friday. Patient states she would change doctors if she could have sx scheduled on a Friday. Informed Dora Sims she will let me know what could be done.

## 2012-11-08 NOTE — Telephone Encounter (Signed)
Pt will be keeping her SX  appt with DR. Newman on 11/25/12 per Dora Sims

## 2012-11-09 ENCOUNTER — Encounter (INDEPENDENT_AMBULATORY_CARE_PROVIDER_SITE_OTHER): Payer: Self-pay

## 2012-11-09 ENCOUNTER — Telehealth (INDEPENDENT_AMBULATORY_CARE_PROVIDER_SITE_OTHER): Payer: Self-pay

## 2012-11-09 NOTE — Telephone Encounter (Signed)
Pt states she needs a work note for her sx scheduled for 11/25/2012. She states she needs only day of surgery off. She is scheduled to be off the next 3 days after sx . Work note faxed to [phone]

## 2012-11-12 ENCOUNTER — Encounter: Payer: Self-pay | Admitting: Internal Medicine

## 2012-11-12 ENCOUNTER — Ambulatory Visit (INDEPENDENT_AMBULATORY_CARE_PROVIDER_SITE_OTHER): Payer: BC Managed Care – PPO | Admitting: Internal Medicine

## 2012-11-12 VITALS — BP 122/64 | HR 102 | Ht 61.0 in | Wt 164.0 lb

## 2012-11-12 DIAGNOSIS — E8770 Fluid overload, unspecified: Secondary | ICD-10-CM

## 2012-11-12 DIAGNOSIS — E8779 Other fluid overload: Secondary | ICD-10-CM

## 2012-11-12 DIAGNOSIS — G909 Disorder of the autonomic nervous system, unspecified: Secondary | ICD-10-CM

## 2012-11-12 DIAGNOSIS — G901 Familial dysautonomia [Riley-Day]: Secondary | ICD-10-CM

## 2012-11-12 NOTE — Patient Instructions (Signed)
Your physician wants you to follow-up in: 12 months with Dr Allred You will receive a reminder letter in the mail two months in advance. If you don't receive a letter, please call our office to schedule the follow-up appointment.  

## 2012-11-14 ENCOUNTER — Encounter: Payer: Self-pay | Admitting: Internal Medicine

## 2012-11-14 DIAGNOSIS — G901 Familial dysautonomia [Riley-Day]: Secondary | ICD-10-CM | POA: Insufficient documentation

## 2012-11-14 NOTE — Progress Notes (Signed)
PCP: Tracey Blamer, MD  Tracey Reyes is a 26 y.o. female who presents today for routine electrophysiology followup.  She recently delivered a child.  She received agressive volume resuscitation for chronically low blood pressure and required rehospitalization for volume overload.  Following diuresis, she did well and was seen in our office by [name] 10/13.  Since that time, she continues to do well.  She denies volume overload.  Her dysautonomia and chronic dizziness are stable.  She denies recent syncope.  Her sinus tachycardia is stable.   Today, she denies symptoms of palpitations, chest pain, shortness of breath,  lower extremity edema, dizziness, presyncope, or syncope.  The patient is otherwise without complaint today.   Past Medical History  Diagnosis Date  . Dysautonomia orthostatic hypotension syndrome     with frequent sinus tachycardia and prior syncope  . Thyroiditis     a. h/o hoshimoto's  . Asthma   . Depression   . Eczema   . PCOS (polycystic ovarian syndrome)   . Gallstones     a. on actigall  . Syncope     a. 03/2010 Echo NL EF, Gr 1 DD;  b. 03/2012 in setting of palpitations/pregnancy;  c. echo 04/02/12: EF 60-65% , felt to be due to dysautonomia  . H/O varicella   . Hashimoto's disease   . Abnormal Pap smear   . Cholecystitis   . Chronic kidney disease     stones since a child  . PONV (postoperative nausea and vomiting)   . S/P cesarean section (9/23, FTP) 08/31/2012  . Gestational thrombocytopenia 08/31/2012  . Postpartum care following cesarean delivery 08/31/2012  . Rh negative, maternal 09/02/2012   Past Surgical History  Procedure Date  . Dilation and curettage of uterus     after miscarriage 1/11  . Appendectomy   . Knee arthroscopy 2001    right  . Cesarean section 08/30/2012    Procedure: CESAREAN SECTION;  Surgeon: Lenoard Aden, MD;  Location: [facility];  Service: Obstetrics;  Laterality: N/A;    Current Outpatient Prescriptions   Medication Sig Dispense Refill  . albuterol (VENTOLIN HFA) 108 (90 BASE) MCG/ACT inhaler Inhale 1 puff into the lungs every 6 (six) hours as needed. For shortness of breath       . busPIRone (BUSPAR) 5 MG tablet Take 5 mg by mouth 2 (two) times daily as needed. For anxiety      . ibuprofen (ADVIL,MOTRIN) 200 MG tablet Take 200 mg by mouth every 6 (six) hours as needed. For pain      . Norethindrone-Ethinyl Estradiol-Fe (GENERESS FE) 0.8-25 MG-MCG tablet Chew 1 tablet by mouth daily.      . ursodiol (ACTIGALL) 250 MG tablet Take 250 mg by mouth 2 (two) times daily as needed. For gallstones        Physical Exam: Filed Vitals:   11/12/12 1415  BP: 122/64  Pulse: 102  Height: 5\' 1"  (1.549 m)  Weight: 164 lb (74.39 kg)  SpO2: 98%    GEN- The patient is well appearing, alert and oriented x 3 today.   Head- normocephalic, atraumatic Eyes-  Sclera clear, conjunctiva pink Ears- hearing intact Oropharynx- clear Lungs- Clear to ausculation bilaterally, normal work of breathing Heart- Regular rate and rhythm, no murmurs, rubs or gallops, PMI not laterally displaced GI- soft, NT, ND, + BS Extremities- no clubbing, cyanosis, or edema  ekg today reveals sinus tachycardia 102 bpm, nonspecific ST/T changes 09/10/12 echo is reviewed  Assessment and  Plan:

## 2012-11-14 NOTE — Assessment & Plan Note (Addendum)
This appears to have been due to aggressive volume resuscitation previously.  Her EF remains normal.  Avoid aggressive volume administration in the future.

## 2012-11-14 NOTE — Assessment & Plan Note (Signed)
She has very stable but chronic dysautonomia with chronically low BP, postural symptoms, and sinus tachycardia.  She is doing well at this time. I do not think that she will require florinef presently but will continue with oral hydration and salt liberalization. We need to avoid aggressive volume resuscitation with procedures in the future. No change at this time

## 2012-11-18 ENCOUNTER — Encounter (HOSPITAL_COMMUNITY): Payer: Self-pay | Admitting: Respiratory Therapy

## 2012-11-19 ENCOUNTER — Telehealth (INDEPENDENT_AMBULATORY_CARE_PROVIDER_SITE_OTHER): Payer: Self-pay

## 2012-11-19 ENCOUNTER — Encounter (HOSPITAL_COMMUNITY): Payer: Self-pay

## 2012-11-19 ENCOUNTER — Telehealth (INDEPENDENT_AMBULATORY_CARE_PROVIDER_SITE_OTHER): Payer: Self-pay | Admitting: General Surgery

## 2012-11-19 ENCOUNTER — Encounter (HOSPITAL_COMMUNITY)
Admission: RE | Admit: 2012-11-19 | Discharge: 2012-11-19 | Disposition: A | Discharge status: Home / Self Care | Payer: BC Managed Care – PPO | Source: Ambulatory Visit | Attending: Surgery | Admitting: Surgery

## 2012-11-19 HISTORY — DX: Anemia, unspecified: D64.9

## 2012-11-19 HISTORY — DX: Urinary tract infection, site not specified: N39.0

## 2012-11-19 HISTORY — DX: Anxiety disorder, unspecified: F41.9

## 2012-11-19 LAB — COMPREHENSIVE METABOLIC PANEL
ALT: 39 U/L — ABNORMAL HIGH (ref 0–35)
AST: 31 U/L (ref 0–37)
CO2: 20 mEq/L (ref 19–32)
Chloride: 102 mEq/L (ref 96–112)
Creatinine, Ser: 0.64 mg/dL (ref 0.50–1.10)
GFR calc Af Amer: >90 mL/min (ref >90)
GFR calc non Af Amer: >90 mL/min (ref >90)
Glucose, Bld: 96 mg/dL (ref 70–99)
Total Bilirubin: 0.4 mg/dL (ref 0.3–1.2)

## 2012-11-19 LAB — CBC WITH DIFFERENTIAL/PLATELET
Basophils Absolute: 0 10*3/uL (ref 0.0–0.1)
HCT: 42.8 % (ref 36.0–46.0)
Hemoglobin: 14.3 g/dL (ref 12.0–15.0)
Lymphocytes Relative: 29 % (ref 12–46)
Lymphs Abs: 2.4 10*3/uL (ref 0.7–4.0)
MCV: 81.2 fL (ref 78.0–100.0)
Monocytes Absolute: 0.5 10*3/uL (ref 0.1–1.0)
Neutro Abs: 5.3 10*3/uL (ref 1.7–7.7)
RBC: 5.27 MIL/uL — ABNORMAL HIGH (ref 3.87–5.11)
RDW: 14.5 % (ref 11.5–15.5)
WBC: 8.4 10*3/uL (ref 4.0–10.5)

## 2012-11-19 LAB — SURGICAL PCR SCREEN: Staphylococcus aureus: NEGATIVE

## 2012-11-19 NOTE — Telephone Encounter (Signed)
Ask pt to return call so I may answer  Her surgery  Questions

## 2012-11-19 NOTE — Telephone Encounter (Signed)
[name] called to report that pt would only sign consent form for the umbilical hernia repair and not for the laparoscopic cholecystectomy because she did not wish to be,out of work for an extended period of time. She wants gb surgery later. Her surgery is 11-25-12. Please call and speak with pt about this issue and help to sort out her concerns/ gy/ Also please call Debra at short stay and let her know the outcome/ [phone]/thanks

## 2012-11-19 NOTE — Telephone Encounter (Signed)
Expressed to patient gallbladder surgery would not  keep her out of work any longer than just having the  hernia repair surgery. Patient verbalized understanding and agreed  to go forward as Dr. Ezzard Standing planned / orders.

## 2012-11-19 NOTE — Pre-Procedure Instructions (Signed)
20 Tracey Reyes  11/19/2012   Your procedure is scheduled on:  Thursday, Dec 19  Report to [facility] on the 3 rd floor at 0530 AM.  Call this number if you have problems the morning of surgery: [phone]   Remember:   Do not eat food or liquids:After Midnight.Wednesday night   .  Take these medicines the morning of surgery with A SIP OF WATER: birth control pill, Buspar   Do not wear jewelry, make-up or nail polish.  Do not wear lotions, powders, or perfumes. You may wear deodorant.  Do not shave 48 hours prior to surgery. Men may shave face and neck.  Do not bring valuables to the hospital.  Contacts, dentures or bridgework may not be worn into surgery.  Leave suitcase in the car. After surgery it may be brought to your room.  For patients admitted to the hospital, checkout time is 11:00 AM the day of discharge.   Patients discharged the day of surgery will not be allowed to drive home.  Name and phone number of your driver: Mother  Special Instructions: Shower using CHG 2 nights before surgery and the night before surgery.  If you shower the day of surgery use CHG.  Use special wash - you have one bottle of CHG for all showers.  You should use approximately 1/3 of the bottle for each shower.   Please read over the following fact sheets that you were given: Pain Booklet, Coughing and Deep Breathing, MRSA Information and Surgical Site Infection Prevention

## 2012-11-19 NOTE — Telephone Encounter (Signed)
Spoke with [name] @ [facility] short stay informed her I had spoke with Diamantina Monks answered her questions about surgery scheduled for 11-25-12. Ms. Tracey Reyes is going forward with gallbladder and hernia surgery as ordered by Dr. Ezzard Standing

## 2012-11-19 NOTE — Progress Notes (Signed)
Patient states she is not going to have the gallbladder removed ;only the umbilical hernia repair. States she has called the office and spoken to the surgery scheduler. [facility] office and spoke to [place]. States Dr Ezzard Standing is out of town. She will ask Rivka Barbara, a nurse, to call the patient today and educate the patient re: recovery period and surgical procedures. The office is to call me back re: consent for surgery.

## 2012-11-22 ENCOUNTER — Encounter (HOSPITAL_COMMUNITY): Payer: Self-pay | Admitting: Vascular Surgery

## 2012-11-22 NOTE — Consult Note (Addendum)
Anesthesia chart review: Patient is a 26 year old female scheduled for laparoscopic cholecystectomy and repair of umbilical hernia by Dr. Ezzard Standing on 11/25/2012. She is status post cesarean section on 08/31/2012 for a baby boy and developed post-operative fluid volume overload following aggressive volume resuscitation for hypotension and required re-hospitalization for diuresis. Other history includes nonsmoker, postoperative nausea and vomiting, dysautonomic orthostatic hypotension syndrome, syncope, asthma, gestational thrombocytopenia, anemia, anxiety, Hashimoto's thyroiditis, polycystic ovarian syndrome, depression, nephrolithiasis, appendectomy 10/2010.  (Her PAT RN asked her to speak with me at her PAT appointment due to her dysautonomia, but she declined as she had her newborn with her and just wanted to get home.)  PCP is Dr. Johny Blamer.  Vitals at PAT showed a BP of 102/52 and HR of 91 bpm.  Her Cardiologist is Dr. Johney Frame, last visit 11/14/12.  He felt her post operative volume overload in September 2013 was "due to aggressive volume resuscitation previously.  Her EF remains normal.  Avoid aggressive volume administration in the future."  In regards to her dysautonomia he writes, "She has very stable but chronic dysautonomia with chronically low BP, postural symptoms, and sinus tachycardia. She is doing well at this time. I do not think that she will require florinef presently but will continue with oral hydration and salt liberalization. We need to avoid aggressive volume resuscitation with procedures in the future. No change at this time."  EKG on 110/09/13 showed ST @ 102 bpm, non-specific ST/T wave changes.  Echo on 09/10/12 showed: - Left ventricle: The cavity size was normal. Wall thickness was normal. Systolic function was normal. The estimated ejection fraction was in the range of 55% to 60%. - Pericardium, extracardiac: A trivial pericardial effusion was identified. There was a left  pleural effusion. - Tricuspid valve: Trivial regurgitation.   CXR on 09/09/12 showed: 1. Small bilateral pleural effusions with basilar atelectasis. Cannot exclude pneumonia.  2. Cardiomegaly. Question minimal pulmonary vascular congestion.  Medications listed include: Generess FE, Ventolin HFA, Buspar, Actigall, and Advil.  Pre-operative labs noted.  I will review her history with one of our anesthesiologists.  Her anesthesia record from 10/21/10 is on her chart for review if needed.  We may need to consider a follow-up CXR.  Her sats were 97% on 11/19/12.  Shonna Chock, PA-C 11/22/12 1800  Addendum: 11/23/12 1415 Reviewed history with Anesthesiologist Dr. Jacklynn Bue.  If no acute cardiopulmonary symptoms on arrival then would not plan to repeat CXR pre-operatively.

## 2012-11-24 MED ORDER — CIPROFLOXACIN IN D5W 400 MG/200ML IV SOLN
400.0000 mg | INTRAVENOUS | Status: AC
  Administered 2012-11-25: 400 mg via INTRAVENOUS
  Filled 2012-11-24: qty 200

## 2012-11-24 MED ORDER — CHLORHEXIDINE GLUCONATE 4 % EX LIQD: 1.0000 "application " | Freq: Once | CUTANEOUS | Status: DC

## 2012-11-25 ENCOUNTER — Ambulatory Visit (HOSPITAL_COMMUNITY)
Admission: RE | Admit: 2012-11-25 | Discharge: 2012-11-25 | Disposition: A | Discharge status: Home / Self Care | Payer: BC Managed Care – PPO | Source: Ambulatory Visit | Attending: Surgery | Admitting: Surgery

## 2012-11-25 ENCOUNTER — Encounter (HOSPITAL_COMMUNITY): Payer: Self-pay | Admitting: Vascular Surgery

## 2012-11-25 ENCOUNTER — Ambulatory Visit (HOSPITAL_COMMUNITY): Payer: BC Managed Care – PPO | Admitting: Vascular Surgery

## 2012-11-25 ENCOUNTER — Encounter (HOSPITAL_COMMUNITY)
Admission: RE | Disposition: A | Discharge status: Home / Self Care | Payer: Self-pay | Source: Ambulatory Visit | Attending: Surgery

## 2012-11-25 ENCOUNTER — Encounter (HOSPITAL_COMMUNITY): Payer: Self-pay | Admitting: *Deleted

## 2012-11-25 DIAGNOSIS — K8010 Calculus of gallbladder with chronic cholecystitis without obstruction: Secondary | ICD-10-CM | POA: Insufficient documentation

## 2012-11-25 DIAGNOSIS — F411 Generalized anxiety disorder: Secondary | ICD-10-CM | POA: Insufficient documentation

## 2012-11-25 DIAGNOSIS — K429 Umbilical hernia without obstruction or gangrene: Secondary | ICD-10-CM | POA: Insufficient documentation

## 2012-11-25 DIAGNOSIS — E282 Polycystic ovarian syndrome: Secondary | ICD-10-CM | POA: Insufficient documentation

## 2012-11-25 DIAGNOSIS — J45909 Unspecified asthma, uncomplicated: Secondary | ICD-10-CM | POA: Insufficient documentation

## 2012-11-25 HISTORY — PX: CHOLECYSTECTOMY: SHX55

## 2012-11-25 HISTORY — PX: UMBILICAL HERNIA REPAIR: SHX196

## 2012-11-25 SURGERY — REPAIR, HERNIA, UMBILICAL, ADULT
Anesthesia: General | Wound class: Clean Contaminated

## 2012-11-25 MED ORDER — TRAMADOL HCL 50 MG PO TABS: 50.0000 mg | ORAL_TABLET | Freq: Four times a day (QID) | ORAL | Status: DC | PRN

## 2012-11-25 MED ORDER — MIDAZOLAM HCL 5 MG/5ML IJ SOLN
INTRAMUSCULAR | Status: DC | PRN
  Administered 2012-11-25: 2 mg via INTRAVENOUS

## 2012-11-25 MED ORDER — PROMETHAZINE HCL 25 MG/ML IJ SOLN
6.2500 mg | INTRAMUSCULAR | Status: DC | PRN
  Administered 2012-11-25: 6.25 mg via INTRAVENOUS

## 2012-11-25 MED ORDER — BUPIVACAINE-EPINEPHRINE PF 0.25-1:200000 % IJ SOLN
INTRAMUSCULAR | Status: AC
  Filled 2012-11-25: qty 30

## 2012-11-25 MED ORDER — BUPIVACAINE HCL (PF) 0.25 % IJ SOLN
INTRAMUSCULAR | Status: AC
  Filled 2012-11-25: qty 30

## 2012-11-25 MED ORDER — HYDROMORPHONE HCL PF 1 MG/ML IJ SOLN
INTRAMUSCULAR | Status: AC
  Filled 2012-11-25: qty 1

## 2012-11-25 MED ORDER — PROMETHAZINE HCL 25 MG/ML IJ SOLN
INTRAMUSCULAR | Status: AC
  Filled 2012-11-25: qty 1

## 2012-11-25 MED ORDER — BUPIVACAINE HCL (PF) 0.25 % IJ SOLN
INTRAMUSCULAR | Status: DC | PRN
  Administered 2012-11-25: 30 mL

## 2012-11-25 MED ORDER — ONDANSETRON HCL 4 MG/2ML IJ SOLN
INTRAMUSCULAR | Status: DC | PRN
  Administered 2012-11-25 (×2): 4 mg via INTRAVENOUS

## 2012-11-25 MED ORDER — NEOSTIGMINE METHYLSULFATE 1 MG/ML IJ SOLN
INTRAMUSCULAR | Status: DC | PRN
  Administered 2012-11-25: 4 mg via INTRAVENOUS

## 2012-11-25 MED ORDER — FENTANYL CITRATE 0.05 MG/ML IJ SOLN: 50.0000 ug | Freq: Once | INTRAMUSCULAR | Status: DC

## 2012-11-25 MED ORDER — LACTATED RINGERS IV SOLN
INTRAVENOUS | Status: DC | PRN
  Administered 2012-11-25 (×2): via INTRAVENOUS

## 2012-11-25 MED ORDER — OXYCODONE HCL 5 MG PO TABS: 5.0000 mg | ORAL_TABLET | Freq: Once | ORAL | Status: DC | PRN

## 2012-11-25 MED ORDER — SODIUM CHLORIDE 0.9 % IR SOLN
Status: DC | PRN
  Administered 2012-11-25: 1000 mL

## 2012-11-25 MED ORDER — PROPOFOL 10 MG/ML IV BOLUS
INTRAVENOUS | Status: DC | PRN
  Administered 2012-11-25: 160 mg via INTRAVENOUS

## 2012-11-25 MED ORDER — 0.9 % SODIUM CHLORIDE (POUR BTL) OPTIME
TOPICAL | Status: DC | PRN
  Administered 2012-11-25: 1000 mL

## 2012-11-25 MED ORDER — MIDAZOLAM HCL 2 MG/2ML IJ SOLN: 1.0000 mg | INTRAMUSCULAR | Status: DC | PRN

## 2012-11-25 MED ORDER — FENTANYL CITRATE 0.05 MG/ML IJ SOLN
INTRAMUSCULAR | Status: DC | PRN
  Administered 2012-11-25: 25 ug via INTRAVENOUS
  Administered 2012-11-25 (×3): 50 ug via INTRAVENOUS
  Administered 2012-11-25: 25 ug via INTRAVENOUS

## 2012-11-25 MED ORDER — OXYCODONE HCL 5 MG/5ML PO SOLN: 5.0000 mg | Freq: Once | ORAL | Status: DC | PRN

## 2012-11-25 MED ORDER — LIDOCAINE HCL (CARDIAC) 20 MG/ML IV SOLN
INTRAVENOUS | Status: DC | PRN
  Administered 2012-11-25: 6035 mg via INTRAVENOUS

## 2012-11-25 MED ORDER — GLYCOPYRROLATE 0.2 MG/ML IJ SOLN
INTRAMUSCULAR | Status: DC | PRN
  Administered 2012-11-25: .7 mg via INTRAVENOUS

## 2012-11-25 MED ORDER — HYDROMORPHONE HCL PF 1 MG/ML IJ SOLN
0.2500 mg | INTRAMUSCULAR | Status: DC | PRN
  Administered 2012-11-25: 0.25 mg via INTRAVENOUS
  Administered 2012-11-25: 0.5 mg via INTRAVENOUS

## 2012-11-25 SURGICAL SUPPLY — 77 items
ADH SKN CLS APL DERMABOND .7 (GAUZE/BANDAGES/DRESSINGS) ×2
APL SKNCLS STERI-STRIP NONHPOA (GAUZE/BANDAGES/DRESSINGS)
APPLIER CLIP 5 13 M/L LIGAMAX5 (MISCELLANEOUS)
APPLIER CLIP ROT 10 11.4 M/L (STAPLE)
APR CLP MED LRG 11.4X10 (STAPLE)
APR CLP MED LRG 5 ANG JAW (MISCELLANEOUS)
BAG SPEC RTRVL LRG 6X4 10 (ENDOMECHANICALS) ×2
BENZOIN TINCTURE PRP APPL 2/3 (GAUZE/BANDAGES/DRESSINGS) IMPLANT
BINDER ABD UNIV 12 45-62 (WOUND CARE) IMPLANT
BINDER ABDOMINAL 46IN 62IN (WOUND CARE) ×3
BLADE SURG 10 STRL SS (BLADE) ×2 IMPLANT
BLADE SURG 15 STRL LF DISP TIS (BLADE) ×2 IMPLANT
BLADE SURG 15 STRL SS (BLADE)
BLADE SURG ROTATE 9660 (MISCELLANEOUS) IMPLANT
CANISTER SUCTION 2500CC (MISCELLANEOUS) ×3 IMPLANT
CHLORAPREP W/TINT 26ML (MISCELLANEOUS) ×3 IMPLANT
CHOLANGIOGRAM CATH TAUT (CATHETERS) ×3 IMPLANT
CLIP APPLIE 5 13 M/L LIGAMAX5 (MISCELLANEOUS) IMPLANT
CLIP APPLIE ROT 10 11.4 M/L (STAPLE) IMPLANT
CLOTH BEACON ORANGE TIMEOUT ST (SAFETY) ×3 IMPLANT
COVER MAYO STAND STRL (DRAPES) ×3 IMPLANT
COVER SURGICAL LIGHT HANDLE (MISCELLANEOUS) ×3 IMPLANT
DECANTER SPIKE VIAL GLASS SM (MISCELLANEOUS) ×3 IMPLANT
DERMABOND ADVANCED (GAUZE/BANDAGES/DRESSINGS) ×1
DERMABOND ADVANCED .7 DNX12 (GAUZE/BANDAGES/DRESSINGS) ×2 IMPLANT
DRAPE C-ARM 42X72 X-RAY (DRAPES) ×3 IMPLANT
DRAPE LAPAROTOMY TRNSV 102X78 (DRAPE) ×1 IMPLANT
DRAPE PED LAPAROTOMY (DRAPES) IMPLANT
DRAPE UTILITY 15X26 W/TAPE STR (DRAPE) ×6 IMPLANT
ELECT CAUTERY BLADE 6.4 (BLADE) ×2 IMPLANT
ELECT REM PT RETURN 9FT ADLT (ELECTROSURGICAL) ×3
ELECTRODE REM PT RTRN 9FT ADLT (ELECTROSURGICAL) ×2 IMPLANT
FILTER SMOKE EVAC LAPAROSHD (FILTER) ×3 IMPLANT
GLOVE BIO SURGEON STRL SZ7.5 (GLOVE) ×1 IMPLANT
GLOVE BIOGEL PI IND STRL 7.0 (GLOVE) IMPLANT
GLOVE BIOGEL PI INDICATOR 7.0 (GLOVE) ×2
GLOVE SURG SIGNA 7.5 PF LTX (GLOVE) ×4 IMPLANT
GOWN BRE IMP SLV AUR XL STRL (GOWN DISPOSABLE) ×1 IMPLANT
GOWN STRL NON-REIN LRG LVL3 (GOWN DISPOSABLE) ×9 IMPLANT
GOWN STRL REIN XL XLG (GOWN DISPOSABLE) ×3 IMPLANT
IV CATH 14GX2 1/4 (CATHETERS) ×3 IMPLANT
KIT BASIN OR (CUSTOM PROCEDURE TRAY) ×3 IMPLANT
KIT ROOM TURNOVER OR (KITS) ×3 IMPLANT
NDL HYPO 25GX1X1/2 BEV (NEEDLE) ×2 IMPLANT
NEEDLE HYPO 25GX1X1/2 BEV (NEEDLE) ×3 IMPLANT
NS IRRIG 1000ML POUR BTL (IV SOLUTION) ×3 IMPLANT
PACK SURGICAL SETUP 50X90 (CUSTOM PROCEDURE TRAY) ×3 IMPLANT
PAD ARMBOARD 7.5X6 YLW CONV (MISCELLANEOUS) ×6 IMPLANT
PENCIL BUTTON HOLSTER BLD 10FT (ELECTRODE) ×3 IMPLANT
POUCH SPECIMEN RETRIEVAL 10MM (ENDOMECHANICALS) ×3 IMPLANT
SCISSORS LAP 5X35 DISP (ENDOMECHANICALS) IMPLANT
SET IRRIG TUBING LAPAROSCOPIC (IRRIGATION / IRRIGATOR) ×3 IMPLANT
SPECIMEN JAR SMALL (MISCELLANEOUS) ×3 IMPLANT
SPONGE GAUZE 4X4 12PLY (GAUZE/BANDAGES/DRESSINGS) ×1 IMPLANT
SPONGE INTESTINAL PEANUT (DISPOSABLE) IMPLANT
SPONGE LAP 18X18 X RAY DECT (DISPOSABLE) ×2 IMPLANT
SPONGE LAP 4X18 X RAY DECT (DISPOSABLE) ×2 IMPLANT
STAPLER VISISTAT 35W (STAPLE) IMPLANT
STOPCOCK 4 WAY LG BORE MALE ST (IV SETS) ×3 IMPLANT
STRIP CLOSURE SKIN 1/2X4 (GAUZE/BANDAGES/DRESSINGS) IMPLANT
SUT MON AB 5-0 PS2 18 (SUTURE) IMPLANT
SUT NOVA NAB DX-16 0-1 5-0 T12 (SUTURE) IMPLANT
SUT NOVA NAB GS-21 0 18 T12 DT (SUTURE) ×1 IMPLANT
SUT VIC AB 3-0 SH 18 (SUTURE) ×3 IMPLANT
SUT VIC AB 5-0 PS2 18 (SUTURE) ×3 IMPLANT
SYR CONTROL 10ML LL (SYRINGE) ×3 IMPLANT
TAPE CLOTH SURG 4X10 WHT LF (GAUZE/BANDAGES/DRESSINGS) ×1 IMPLANT
TOWEL OR 17X24 6PK STRL BLUE (TOWEL DISPOSABLE) ×3 IMPLANT
TOWEL OR 17X26 10 PK STRL BLUE (TOWEL DISPOSABLE) ×3 IMPLANT
TRAY LAPAROSCOPIC (CUSTOM PROCEDURE TRAY) ×3 IMPLANT
TROCAR XCEL BLUNT TIP 100MML (ENDOMECHANICALS) ×3 IMPLANT
TROCAR Z-THREAD FIOS 11X100 BL (TROCAR) IMPLANT
TROCAR Z-THREAD FIOS 5X100MM (TROCAR) ×7 IMPLANT
TUBE CONNECTING 12X1/4 (SUCTIONS) IMPLANT
TUBING EXTENTION W/L.L. (IV SETS) ×3 IMPLANT
WATER STERILE IRR 1000ML POUR (IV SOLUTION) IMPLANT
YANKAUER SUCT BULB TIP NO VENT (SUCTIONS) IMPLANT

## 2012-11-25 NOTE — Op Note (Signed)
11/25/2012  9:23 AM  PATIENT:  Tracey Reyes, 26 y.o., female, MRN: [id number]  PREOP DIAGNOSIS:  Umbilica hernia, gallstones  POSTOP DIAGNOSIS:   Chronic cholecystitits, cholelithiasis, umbilical hernia (1.5 cm)  PROCEDURE:   Procedure(s): Open HERNIA REPAIR UMBILICAL ADULT, LAPAROSCOPIC CHOLECYSTECTOMY  (no cholangiogram)  SURGEON:   Ovidio Kin, M.D.  Threasa HeadsIvor Reining, M.D.  ANESTHESIA:   general  Bedelia Person, MD - Anesthesiologist Rogelia Boga, CRNA - CRNA Adair Laundry, CRNA - CRNA  General  EBL:  minimal  ml  BLOOD ADMINISTERED: none  DRAINS: none   LOCAL MEDICATIONS USED:   30 cc 1/4% marcaine  SPECIMEN:   Gall bladder and hernia sac  COUNTS CORRECT:  YES  INDICATIONS FOR PROCEDURE:  Tracey Reyes is a 26 y.o. (DOB: Jul 18, 1986) white female whose primary care physician is Johny Blamer, MD and comes for cholecystectomy and repair of a symptomatic umbilical hernia.   The indications and risks of the gall bladder surgery and umbilical hernia surgery were explained to the patient.  The risks include, but are not limited to, infection, bleeding, common bile duct injury, risk of recurrent hernia, and open surgery.  SURGERY:  The patient was taken to room #1 at [facility].  The abdomen was prepped with chloroprep.  The patient was given 2 gm Ancef at the beginning of the operation.   A time out was held and the surgical checklist run.   An "smiling" infraumbilical incision was made to the rectus fascia and I tried to get into the abdominal cavity through the umbilical hernia.  A 12 mm Hasson trocar was inserted into the abdominal cavity through the infraumbilical incision and secured with a 0 Vicryl suture.  Three additional trocars were inserted: a 5 mm trocar in the sub-xiphoid location, a 5 mm trocar in the right mid subcostal area, and a 5 mm trocar in the right lateral subcostal area.   The abdomen was explored and the liver, stomach, and  bowel that could be seen were unremarkable.   The gall bladder was identified, grasped, and rotated cephalad.  The gall bladder was noted to be intra-hepatic.  Pictures were taken of the gall bladder.  These photos could not be retrieved post op.   Disssection was carried down to the gall bladder/cystic duct junction and the cystic duct isolated.  She did have some adhesions to the duodenum.  A clip was placed on the gall bladder side of the cystic duct.   An intra-operative cholangiogram was attempted.    I attempted to shot a cholangiogram.  But the cystic duct was smaller than the Taut catheter and I could not cannulate the duct.  I thought that I had identified the cystic duct/gall bladder anatomy well.   So I tripley endoclipped the cystic duct and the cystic artery was identified and clipped.  The gall bladder was bluntly and sharpley dissected from the gall bladder bed.   After the gall bladder was removed from the liver, the gall bladder bed and Triangle of Calot were inspected.  There was no bleeding or bile leak.  The gall bladder was placed in a endocatch bag and delivered through the umbilicus.  The abdomen was irrigated with 500 cc saline.   I then extended my incision to the umbilical hernia.  She had approx 1.5 cm defect.  I had incorporated part of this in my umbilical incision for the gall bladder incision.  I closed the fascial defect and  umbilical hernia with five interrupted 0 Novafil sutures.   The trocars were then removed.  I infiltrated 30cc of 1/4% Marcaine into the incisions.  The umbilical port closed in layers with 5-0 monocryl suture. The other port sites were also closed with 5-0 monocryl. The skin was painted with Dermabond.  A pressure dressing was placed at the umbilicus and the patient was placed in an abdominal binder. The patient's sponge and needle count were correct.  The patient was transported to the RR in good condition.   She tolerated the operation well and  she did well from a cardiac standpoint.  She wants to try to go home today and we will see how she does.  Ovidio Kin, MD, [facility] Surgery Pager: [phone] Office phone:  [phone]

## 2012-11-25 NOTE — Anesthesia Postprocedure Evaluation (Signed)
  Anesthesia Post-op Note  Patient: Tracey Reyes  Procedure(s) Performed: Procedure(s) (LRB) with comments: HERNIA REPAIR UMBILICAL ADULT (N/A) LAPAROSCOPIC CHOLECYSTECTOMY ()  Patient Location: PACU  Anesthesia Type:General  Level of Consciousness: awake  Airway and Oxygen Therapy: Patient Spontanous Breathing  Post-op Pain: mild  Post-op Assessment: Post-op Vital signs reviewed, Patient's Cardiovascular Status Stable, Respiratory Function Stable, Patent Airway, No signs of Nausea or vomiting and Pain level controlled  Post-op Vital Signs: stable  Complications: No apparent anesthesia complications

## 2012-11-25 NOTE — Progress Notes (Signed)
Additional sm lap site to rt abd noted with skin glue , CDI

## 2012-11-25 NOTE — Progress Notes (Signed)
Ambulatory to BR without incident, voided QS

## 2012-11-25 NOTE — Anesthesia Preprocedure Evaluation (Addendum)
Anesthesia Evaluation  Patient identified by MRN, date of birth, ID band Patient awake    Reviewed: Allergy & Precautions, H&P , NPO status , Patient's Chart, lab work & pertinent test results  History of Anesthesia Complications (+) PONV  Airway Mallampati: I TM Distance: >3 FB Neck ROM: Full    Dental  (+) Dental Advisory Given   Pulmonary shortness of breath and with exertion, asthma ,  breath sounds clear to auscultation        Cardiovascular Rhythm:Regular Rate:Normal     Neuro/Psych  Headaches, Anxiety Depression    GI/Hepatic   Endo/Other    Renal/GU      Musculoskeletal   Abdominal   Peds  Hematology   Anesthesia Other Findings Orthostatic Dysautonomia  Reproductive/Obstetrics                          Anesthesia Physical Anesthesia Plan  ASA: II  Anesthesia Plan: General   Post-op Pain Management:    Induction: Intravenous  Airway Management Planned: Oral ETT  Additional Equipment:   Intra-op Plan:   Post-operative Plan: Extubation in OR  Informed Consent: I have reviewed the patients History and Physical, chart, labs and discussed the procedure including the risks, benefits and alternatives for the proposed anesthesia with the patient or authorized representative who has indicated his/her understanding and acceptance.   Dental advisory given  Plan Discussed with: CRNA, Surgeon and Anesthesiologist  Anesthesia Plan Comments:        Anesthesia Quick Evaluation

## 2012-11-25 NOTE — Anesthesia Procedure Notes (Signed)
Procedure Name: Intubation Date/Time: 11/25/2012 7:54 AM Performed by: Rogelia Boga Pre-anesthesia Checklist: Patient identified, Emergency Drugs available, Suction available, Patient being monitored and Timeout performed Patient Re-evaluated:Patient Re-evaluated prior to inductionOxygen Delivery Method: Circle system utilized Preoxygenation: Pre-oxygenation with 100% oxygen Ventilation: Mask ventilation without difficulty Laryngoscope Size: Mac and 4 Grade View: Grade I Tube type: Oral Tube size: 7.5 mm Number of attempts: 1 Airway Equipment and Method: Stylet Placement Confirmation: ETT inserted through vocal cords under direct vision,  positive ETCO2 and breath sounds checked- equal and bilateral Secured at: 20 cm Tube secured with: Tape Dental Injury: Teeth and Oropharynx as per pre-operative assessment

## 2012-11-25 NOTE — H&P (Signed)
[facility]  Ovidio Kin, MD, FACS  [address] [place], [place] [address]  Phone: [phone] FAX: [phone]  Re: Tracey Reyes  DOB: 1986-02-13  MRN: [id number]  ASSESSMENT AND PLAN:  1. Symptomatic gall bladder disease.   She has taken actigall (300 mg BID) to controll her symptoms. She had a lot of trouble at the end of her pregnancy, but since delivering the baby, she has had no gall bladder symptoms. Her problems have all been with her umbilical hernia.   His [place] on 12/23/2011 showed 4 stones. By measurement, the stone was 10 mm in August 2012 and 9 mm on the current study. I gave her a copy of the [place]. So the stones are not bigger, but I am not sure how much smaller they really are.   We again talked about her gall bladder and the option of surgery. I gave her another bookd on gall bladder disease.   She wants to try to schedule surgery for both the umbilical hernia and gall bladder in December 2013. We will try to do this as an outpatient. I think her cardiac issues will have to go well for her to go home.  Her mother is here with her today.   2. PCOS  3. History of multiple UTI's.  4. Multiple allergies.  5. Asthma.  6. Anxiety.  7. She had a son Marlene Bast - on 08/31/2012 by C-section.   She sees Dr. Billy Coast.   She had some fluid overload post delivery, which required a readmission to the hospital.  8. Umbilical hernia   I discussed the indications and complications of hernia surgery with the patient. I discussed both the laparoscopic and open approach to hernia repair.. The potential risks of hernia surgery include, but are not limited to, bleeding, infection, open surgery, nerve injury, and recurrence of the hernia. I provided the patient literature about hernia surgery.  9. Fluid overload post pregnancy   Saw Dr. Algis Greenhouse in the hospital, to see Dr. Johney Frame in December. Otherwise no further cardiac issues.   Diagnosis of "chronic  dysautonia" - watch out fluid resuscitation.  HISTORY OF PRESENT ILLNESS:  Chief Complaint   Patient presents with   .  Establish Care     hernia    Tracey Reyes is a 26 y.o. (DOB: 1985-12-29) white female who is a patient of HARRIS, Chrissie Noa, MD and comes to me today for follow up of gall stones and gall bladder disease and newer symptoms of an umbilical hernia.  About 3 weeks ago she developed "spasms" in her abdomen. She noticed a bulge to the right of her umbilicus. She was able to reduce this hernia until yesterday. Because the hernia remained sticking out, she went to the [facility] ER.. They were able to reduce her umbilical hernia in the ER and then she has come to our office today.  Her gall bladder bothered her some at the end of her pregnancy, but none since she delivered hier son.   Review of System:  GI: Had lap appendectomy by Dr. Derrell Lolling - 10/21/2010.  Urologic: No history of kidney stones. Has had multiple UTI's. Has seen Dr. Aldean Ast since age 3.  GYN: Miscarried 2 years ago. Diagnosed with PCOS by Dr. [name] ([facility]).   Social:  Married.  Has sone Mason, just born.  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  Abdomen: Soft. BS positive. No mass or tenderness. In both a supine and standing position, I do not feel an umbilical hernia.   DATA REVIEWED:  Epic and recent labs.  Ovidio Kin, MD, FACS  Office: [phone]

## 2012-11-25 NOTE — Preoperative (Signed)
Beta Blockers   Reason not to administer Beta Blockers:Not Applicable 

## 2012-11-25 NOTE — Progress Notes (Signed)
Waiting on WC to take pt to [facility]

## 2012-11-25 NOTE — Transfer of Care (Signed)
Immediate Anesthesia Transfer of Care Note  Patient: Tracey Reyes  Procedure(s) Performed: Procedure(s) (LRB) with comments: HERNIA REPAIR UMBILICAL ADULT (N/A) LAPAROSCOPIC CHOLECYSTECTOMY ()  Patient Location: PACU  Anesthesia Type:General  Level of Consciousness: awake, alert , oriented and patient cooperative  Airway & Oxygen Therapy: Patient Spontanous Breathing and Patient connected to nasal cannula oxygen  Post-op Assessment: Report given to PACU RN, Post -op Vital signs reviewed and stable and Patient moving all extremities X 4  Post vital signs: Reviewed and stable  Complications: No apparent anesthesia complications

## 2012-11-25 NOTE — Progress Notes (Signed)
Report given to rebecca rn as caregiver 

## 2012-11-25 NOTE — Progress Notes (Signed)
Pt ambulated to BR without difficulty

## 2012-11-29 ENCOUNTER — Encounter (HOSPITAL_COMMUNITY): Payer: Self-pay | Admitting: Surgery

## 2012-12-03 ENCOUNTER — Telehealth (INDEPENDENT_AMBULATORY_CARE_PROVIDER_SITE_OTHER): Payer: Self-pay | Admitting: General Surgery

## 2012-12-03 NOTE — Addendum Note (Signed)
Addended by: Reine Just on: 12/03/2012 10:17 AM   Modules accepted: Orders

## 2012-12-03 NOTE — Telephone Encounter (Signed)
Pt called to ask about some post op problems.  She describes pain at her shoulder blade and constant nausea (no vomiting or diarrhea.)  She is sipping cold gingerale.  Explained how the gas used during laprascopic surgery can cause the pain in the shoulder blade area.  Over time this will resolve.  Suggested she continue to sip the gingerale; also to try salty broths (chicken noodle soup, etc.)  Will page MD for nausea meds.  Paged and updated Dr. Ezzard Standing:  Ordered Zofran 4 mg,  #15, 1 po Q6H prn nausea or vomiting, no refill.  Called Zofran and Hydrocodone 5/325 mg, # 30, 1-2 po Q4-6H prn pain, no refill to [facility]:  [phone]

## 2012-12-10 ENCOUNTER — Ambulatory Visit (INDEPENDENT_AMBULATORY_CARE_PROVIDER_SITE_OTHER): Payer: BC Managed Care – PPO | Admitting: Surgery

## 2012-12-10 ENCOUNTER — Encounter (INDEPENDENT_AMBULATORY_CARE_PROVIDER_SITE_OTHER): Payer: Self-pay | Admitting: Surgery

## 2012-12-10 VITALS — BP 128/62 | HR 78 | Temp 97.8°F | Resp 18 | Ht 60.75 in | Wt 161.0 lb

## 2012-12-10 DIAGNOSIS — K829 Disease of gallbladder, unspecified: Secondary | ICD-10-CM

## 2012-12-10 DIAGNOSIS — K429 Umbilical hernia without obstruction or gangrene: Secondary | ICD-10-CM

## 2012-12-10 NOTE — Progress Notes (Signed)
[facility]  Ovidio Kin, MD,  FACS [address]  [address] [place], [place]    [address] Phone:  [phone] FAX:  [phone]   Re:   Tracey Reyes DOB:   1986-10-17 MRN:   [id number]  ASSESSMENT AND PLAN: 1. Lap Chole with IOC, repair of umbilical hernia - 11/25/2012 - for Symptomatic gall bladder disease.   Has done well.  Still cannot button pants.  Return appt PRN.  2. PCOS  3. History of multiple UTI's.  4. Multiple allergies.  5. Asthma.  6. Anxiety. 7.  She had a son Marlene Bast - on 08/31/2012 by C-section.  She sees Dr. Billy Coast.  She had some fluid overload post delivery, which required a readmission to the hospital.  8.  Umbilical hernia  See #1. 9.  Fluid overload post pregnancy  Saw Dr. Algis Greenhouse in the hospital, to see Dr. Johney Frame in December.  Otherwise no further cardiac issues.  HISTORY OF PRESENT ILLNESS: Chief Complaint  Patient presents with  . Routine Post Op    hernia    Tracey Reyes is a 27 y.o. (DOB: February 28, 1986)  white female who is a patient of HARRIS, Chrissie Noa, MD and comes to me follow up of a lap chole with IOC and umbilical hernia repair..  Review of System: GI:  Had lap appendectomy by Dr. Derrell Lolling - 10/21/2010. Urologic: No history of kidney stones. Has had multiple UTI's. Has seen Dr. Aldean Ast since age 89.  GYN: Miscarried 2 years ago. Diagnosed with PCOS by Dr. [name] ([facility]).  Social: Married. Has son, Marlene Bast, just born. 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 Abdomen:  Soft.  Wounds okay.  DATA REVIEWED: Path to patient.  Ovidio Kin, MD, FACS Office:  [phone]

## 2013-03-09 IMAGING — CR DG CHEST 2V
2 series · 2 of 2 positions shown · non-contrast
Comparison: Chest x-ray of 09/15/2009

CLINICAL DATA: Short of breath, chest tightness, cough, 1 week
postpartum with C-section

CHEST - 2 VIEW

[w chest pa]
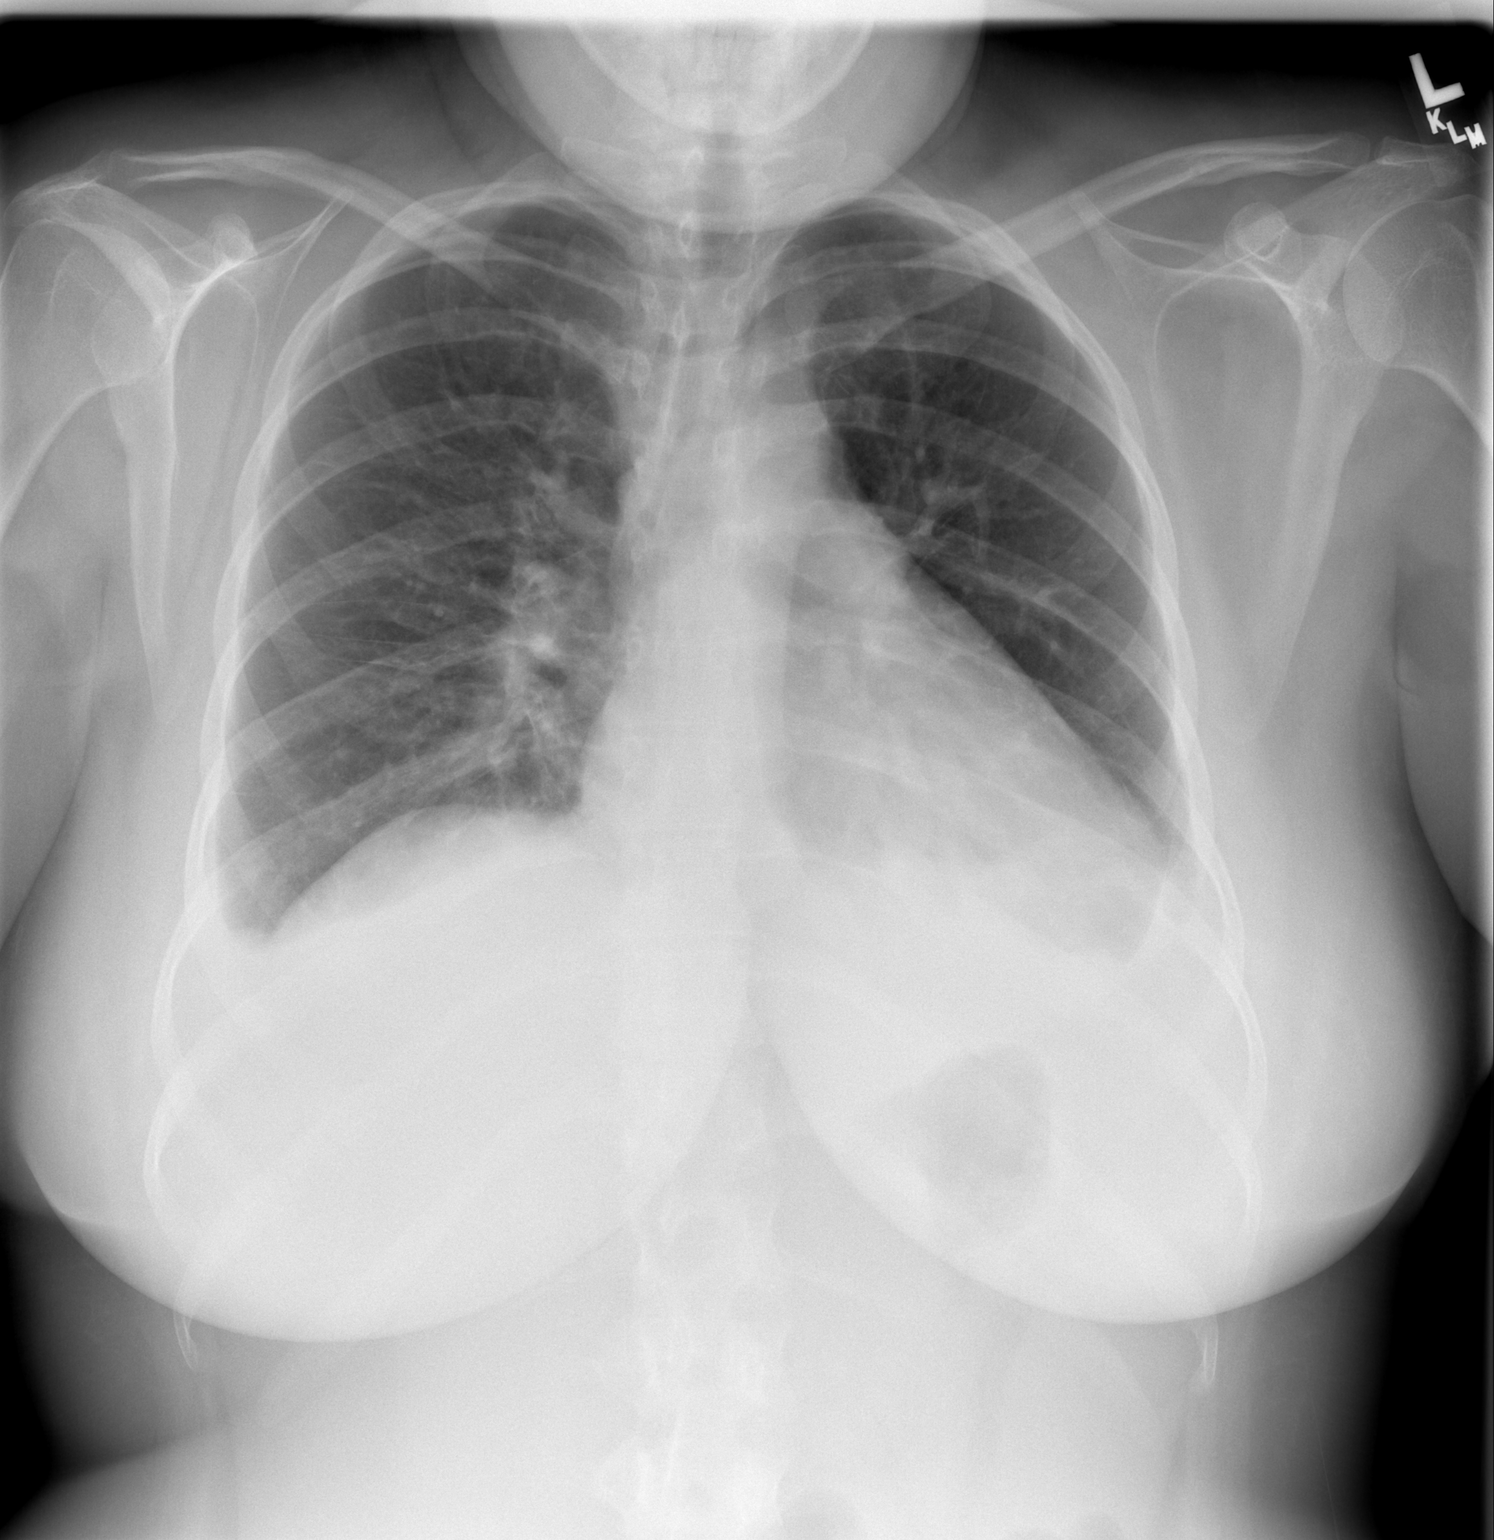

[w chest lat]
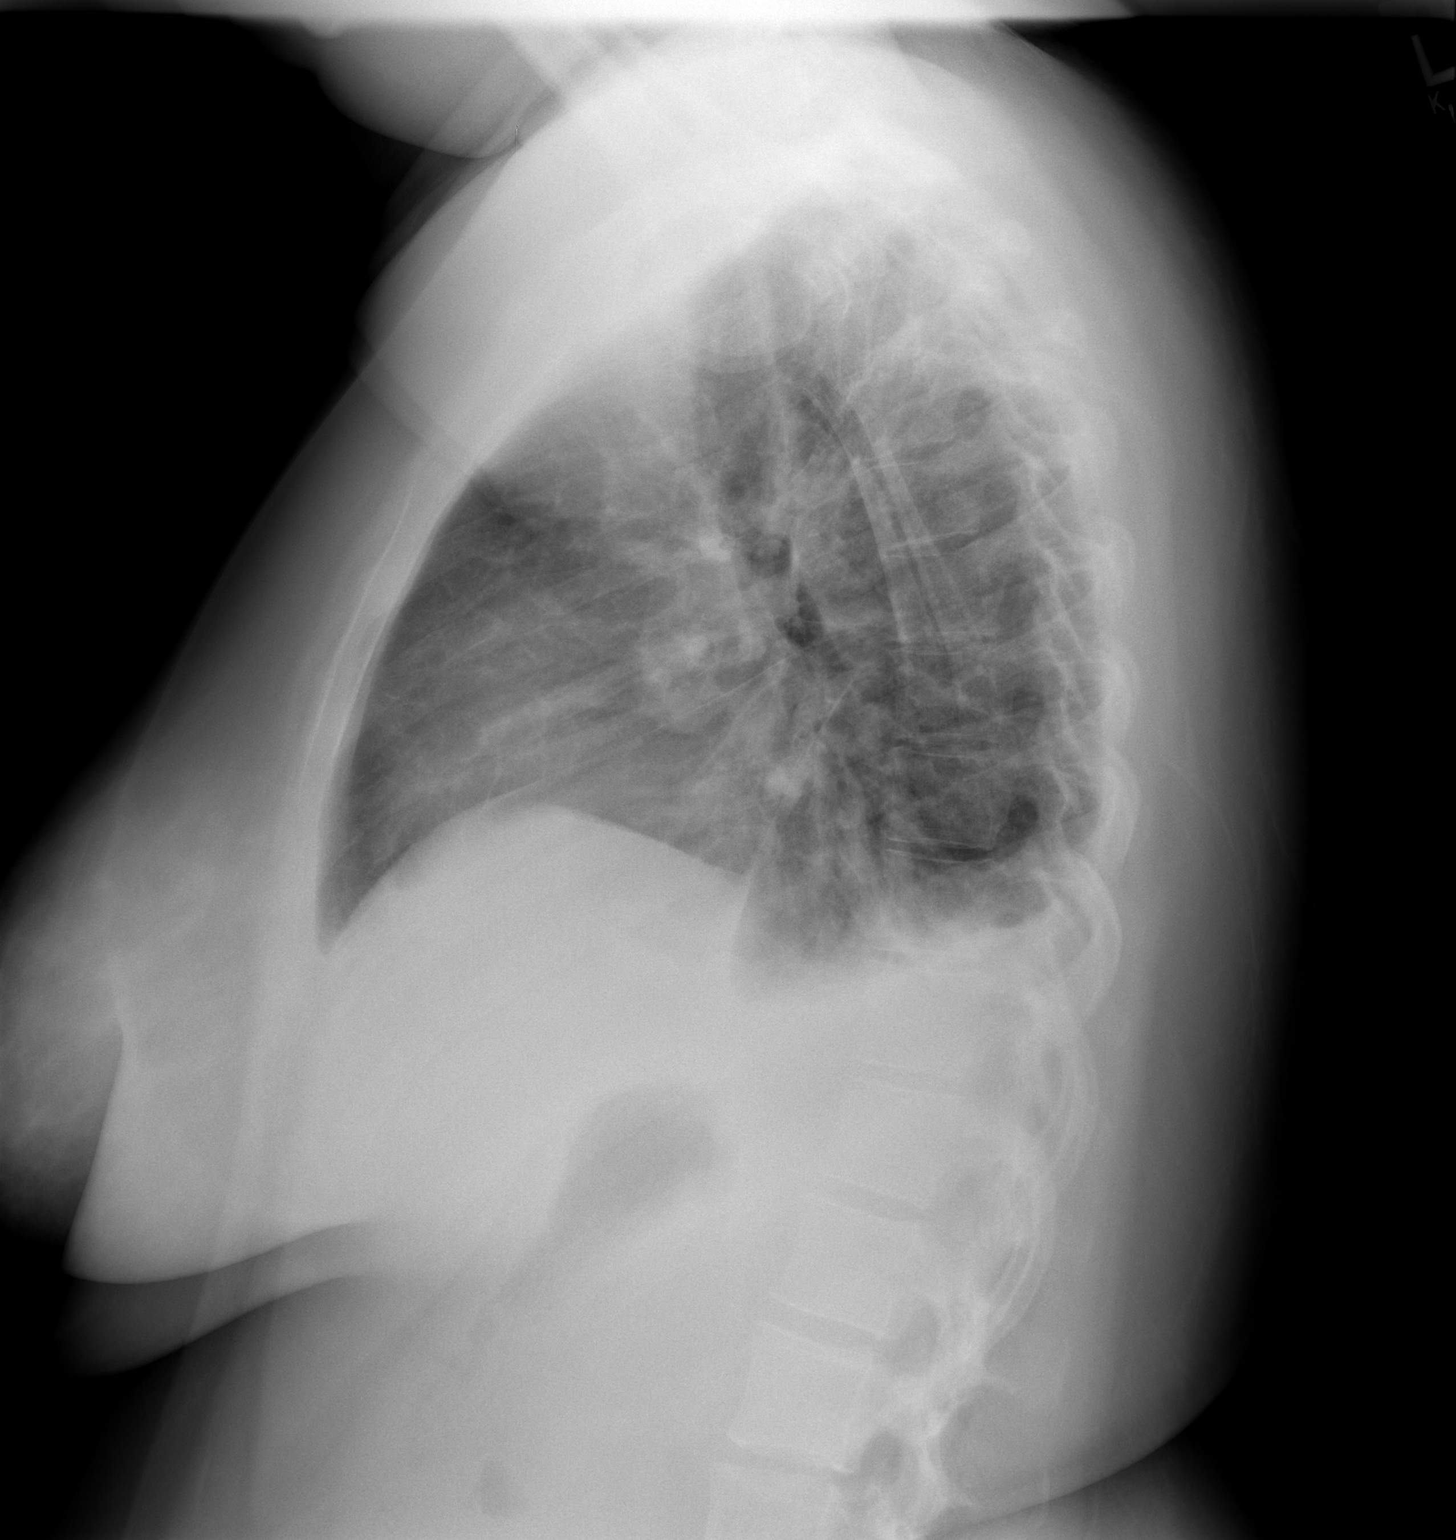

[2 of 2 positions shown; findings below may reference images not displayed]

FINDINGS: The lungs are not optimally aerated.  There are small
bilateral pleural effusions present with bibasilar parenchymal
opacities consistent with atelectasis or pneumonia.  The heart is
slightly prominent and there is minimal pulmonary vascular
congestion present.  No bony abnormality is seen.
IMPRESSION: 1.  Small bilateral pleural effusions with basilar atelectasis.
Cannot exclude pneumonia.
2.  Cardiomegaly.  Question minimal pulmonary vascular congestion.

## 2013-07-11 ENCOUNTER — Telehealth: Payer: Self-pay | Admitting: Internal Medicine

## 2013-07-11 NOTE — Telephone Encounter (Signed)
She had her child 12 months ago   Feels like her heart is racing  Keep a log of BP and HR and we will touch base on Thurs Lower salt intake

## 2013-07-11 NOTE — Telephone Encounter (Signed)
New Prob   Pt states she has not been feeling for the past week. Pt said her BP this morning was 130/93, she said she has been feeling tingling in her feet and they seem swollen. She said if you can not reach her at work, the try her cell [phone].

## 2013-07-11 NOTE — Telephone Encounter (Signed)
Follow Up ° ° ° °Pt calling following up on call from earlier. Please call. °

## 2013-07-18 NOTE — Telephone Encounter (Signed)
New Prob  Returning a phone call from [facility] regarding BP reading

## 2013-07-18 NOTE — Telephone Encounter (Addendum)
Called patient back at work and got voicemail BP120-130/80-97 she is having some SOB and swelling.  I am going to have her come in tomorrow at 8:30 to see PA

## 2013-07-19 ENCOUNTER — Encounter: Payer: Self-pay | Admitting: Cardiology

## 2013-07-19 ENCOUNTER — Telehealth: Payer: Self-pay | Admitting: *Deleted

## 2013-07-19 ENCOUNTER — Ambulatory Visit (INDEPENDENT_AMBULATORY_CARE_PROVIDER_SITE_OTHER): Payer: BC Managed Care – PPO | Admitting: Cardiology

## 2013-07-19 VITALS — BP 104/78 | HR 102 | Ht 60.75 in | Wt 175.4 lb

## 2013-07-19 DIAGNOSIS — G238 Other specified degenerative diseases of basal ganglia: Secondary | ICD-10-CM

## 2013-07-19 DIAGNOSIS — G903 Multi-system degeneration of the autonomic nervous system: Secondary | ICD-10-CM

## 2013-07-19 NOTE — Telephone Encounter (Signed)
Per Nehemiah Settle to call pt and inform her to elevate legs and get on a strict sodium diet and f/u with Dr. Johney Frame in 2-3 weeks. Per pt she will follow instructions and come to f/u appointment. Also informed pt that at this time Dr. Johney Frame suggested to not start Lasix. Pt agreed.

## 2013-07-19 NOTE — Patient Instructions (Signed)
KEEP APPOINTMENT  WITH [facility]   Your physician recommends that you continue on your current medications as directed. Please refer to the Current Medication list given to you today.

## 2013-07-20 ENCOUNTER — Other Ambulatory Visit: Payer: Self-pay

## 2013-07-20 NOTE — Progress Notes (Signed)
ELECTROPHYSIOLOGY OFFICE NOTE  Patient ID: Tracey Reyes MRN: [id number], DOB/AGE: 1986/08/12   Date of Visit: 07/19/2013  Primary Physician: Johny Blamer, MD Primary Cardiologist: Hillis Range, MD Reason for Visit: Elevated BP and LE swelling  History of Present Illness  Tracey Reyes is a 27 y.o. female with dysautonomia orthostatic hypotension syndrome, asthma and Hashimoto's thyroiditis who presents today for LE swelling and elevated BP. Tracey Reyes reports her SBP is normally in the 90s. However, in the last 3 weeks she has noticed her BP running ~130/90 later in the day. She has also had LE swelling. She took Lasix for a couple of days last week which she decided to do on her own. She wants to know if she needs this PRN. She denies CP or SOB. She has chronic intermittent palpitations and dizziness but denies worsening. She denies syncope. She denies any recent changes to her health or medications. She states her thyroid function has been stable. She has decreased her salt intake without much improvement prompting her to schedule this appointment.  Past Medical History Past Medical History  Diagnosis Date  . Dysautonomia orthostatic hypotension syndrome     with frequent sinus tachycardia and prior syncope  . Thyroiditis     a. h/o hoshimoto's  . Asthma   . Depression   . Eczema   . PCOS (polycystic ovarian syndrome)   . Gallstones     a. on actigall  . Syncope     a. 03/2010 Echo NL EF, Gr 1 DD;  b. 03/2012 in setting of palpitations/pregnancy;  c. echo 04/02/12: EF 60-65% , felt to be due to dysautonomia  . H/O varicella   . Hashimoto's disease   . Abnormal Pap smear   . Cholecystitis   . Chronic kidney disease     stones since a child  . PONV (postoperative nausea and vomiting)   . S/P cesarean section (9/23, FTP) 08/31/2012  . Gestational thrombocytopenia 08/31/2012  . Postpartum care following cesarean delivery 08/31/2012  . Rh negative, maternal 09/02/2012    . Anxiety   . Frequent UTI   . Anemia     Past Surgical History Past Surgical History  Procedure Laterality Date  . Dilation and curettage of uterus      after miscarriage 1/11  . Appendectomy    . Knee arthroscopy  2001    right  . Cesarean section  08/30/2012    Procedure: CESAREAN SECTION;  Surgeon: Lenoard Aden, MD;  Location: [facility];  Service: Obstetrics;  Laterality: N/A;  . Umbilical hernia repair  11/25/2012    Procedure: HERNIA REPAIR UMBILICAL ADULT;  Surgeon: Kandis Cocking, MD;  Location[facility];  Service: General;  Laterality: N/A;  . Cholecystectomy  11/25/2012    Procedure: LAPAROSCOPIC CHOLECYSTECTOMY;  Surgeon: Kandis Cocking, MD;  Location: [facility] OR;  Service: General;;    Allergies/Intolerances Allergies  Allergen Reactions  . Pertussis Vaccines Other (See Comments)    Fever 103F, flu-like symptoms  . Sulfonamide Derivatives Hives  . Ceclor [Cefaclor] Hives  . Cranberry Hives    Also blackberry and raspberrsy  . Dexlansoprazole Itching  . Nitrofurantoin Hives  . Penicillins Hives   Current Home Medications Current Outpatient Prescriptions  Medication Sig Dispense Refill  . albuterol (VENTOLIN HFA) 108 (90 BASE) MCG/ACT inhaler Inhale 1 puff into the lungs every 6 (six) hours as needed. For shortness of breath       . Norethindrone-Ethinyl Estradiol-Fe (GENERESS FE) 0.8-25 MG-MCG tablet  Chew 1 tablet by mouth daily.       No current facility-administered medications for this visit.   Social History Social History  . Marital Status: Married   Social History Main Topics  . Smoking status: Never Smoker   . Smokeless tobacco: Not on file     Comment: tobacco use - no  . Alcohol Use: 0.0 oz/week    1-2 Glasses of wine per week     Comment: occasionall; socially  . Drug Use: No   Social History Narrative   Lives in [place]. 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    Graduated from [employer] 2008.    Review of Systems General: No chills,  fever, night sweats or weight changes Cardiovascular: No chest pain, dyspnea on exertion, orthopnea, palpitations, paroxysmal nocturnal dyspnea Dermatological: No rash, lesions or masses Respiratory: No cough, dyspnea Urologic: No hematuria, dysuria Abdominal: No nausea, vomiting, diarrhea, bright red blood per rectum, melena, or hematemesis Neurologic: No visual changes, weakness, changes in mental status All other systems reviewed and are otherwise negative except as noted above.  Physical Exam Vitals: Blood pressure 104/78, pulse 102, height 5' 0.75" (1.543 m), weight 175 lb 6.4 oz (79.561 kg), SpO2 97.00%.  General: Well developed, well appearing 27 y.o. female in no acute distress. HEENT: Normocephalic, atraumatic. EOMs intact. Sclera nonicteric. Oropharynx clear.  Neck: Supple. No JVD. Lungs: Respirations regular and unlabored, CTA bilaterally. No wheezes, rales or rhonchi. Heart: RRR. S1, S2 present. No murmurs, rub, S3 or S4. Abdomen: Soft, non-distended.  Extremities: No clubbing, cyanosis or edema. PT/Radials 2+ and equal bilaterally. Psych: Normal affect. Neuro: Alert and oriented X 3. Moves all extremities spontaneously.   Diagnostics Echocardiogram Oct 2013 Study Conclusions - Left ventricle: The cavity size was normal. Wall thickness was normal. Systolic function was normal. The estimated ejection fraction was in the range of 55% to 60%. - Pericardium, extracardiac: A trivial pericardial effusion was identified. There was a left pleural effusion.  Assessment and Plan 1. Dysautonomia - chronic dizziness and palpitations, postural symptoms - Tracey Reyes states her BP has been running higher than normal for her in addition to LE swelling - for now, salt restrict and elevate LEs - no need for Lasix at this time - normal LV function; no need to repeat echo at this time - follow-up with Dr. Johney Frame in 2-3 weeks  This plan of care was formulated with Dr. Johney Frame today via  phone. Signed, Rick Duff, PA-C 07/20/2013, 10:52 PM

## 2013-08-03 ENCOUNTER — Ambulatory Visit: Payer: BC Managed Care – PPO | Admitting: Physician Assistant

## 2013-08-11 ENCOUNTER — Ambulatory Visit: Payer: BC Managed Care – PPO | Admitting: Internal Medicine

## 2013-09-05 ENCOUNTER — Ambulatory Visit: Payer: BC Managed Care – PPO | Admitting: Internal Medicine

## 2014-05-31 ENCOUNTER — Ambulatory Visit: Payer: BC Managed Care – PPO | Admitting: Internal Medicine

## 2014-06-16 ENCOUNTER — Encounter: Payer: Self-pay | Admitting: Internal Medicine

## 2014-06-16 ENCOUNTER — Ambulatory Visit (INDEPENDENT_AMBULATORY_CARE_PROVIDER_SITE_OTHER): Payer: Managed Care, Other (non HMO) | Admitting: Internal Medicine

## 2014-06-16 VITALS — BP 104/62 | HR 96 | Temp 98.7°F | Resp 12 | Ht 61.0 in | Wt 173.8 lb

## 2014-06-16 DIAGNOSIS — E063 Autoimmune thyroiditis: Secondary | ICD-10-CM

## 2014-06-16 DIAGNOSIS — E282 Polycystic ovarian syndrome: Secondary | ICD-10-CM

## 2014-06-16 LAB — LIPID PANEL
Cholesterol: 177 mg/dL (ref 0–200)
HDL: 44.9 mg/dL (ref >39.00)
LDL CALC: 107 mg/dL — AB (ref 0–99)
NonHDL: 132.1
Total CHOL/HDL Ratio: 4
Triglycerides: 128 mg/dL (ref 0.0–149.0)
VLDL: 25.6 mg/dL (ref 0.0–40.0)

## 2014-06-16 LAB — T4, FREE: Free T4: 0.84 ng/dL (ref 0.60–1.60)

## 2014-06-16 LAB — TSH: TSH: 0.52 u[IU]/mL (ref 0.35–4.50)

## 2014-06-16 LAB — HEMOGLOBIN A1C: HEMOGLOBIN A1C: 5.3 % (ref 4.6–6.5)

## 2014-06-16 LAB — T3, FREE: T3, Free: 3.5 pg/mL (ref 2.3–4.2)

## 2014-06-16 MED ORDER — LEVONORGEST-ETH ESTRAD 91-DAY 0.15-0.03 &0.01 MG PO TABS: 1.0000 | ORAL_TABLET | Freq: Every day | ORAL | Status: AC

## 2014-06-16 NOTE — Patient Instructions (Addendum)
Please stop at the lab. Please try to join MyChart for easier communication.  Stop your current OCP and start Seasonique.  Please consider the following ways to cut down carbs and fat and increase fiber and micronutrients in your diet:  - substitute whole grain for white bread or pasta - substitute brown rice for white rice - substitute 90-calorie flat bread pieces for slices of bread when possible - substitute sweet potatoes or yams for white potatoes - substitute humus for margarine - substitute tofu for cheese when possible - substitute almond or rice milk for regular milk (would not drink soy milk daily due to concern for soy estrogen influence on breast cancer risk) - substitute dark chocolate for other sweets when possible - substitute water - can add lemon or orange slices for taste - for diet sodas (artificial sweeteners will trick your body that you can eat sweets without getting calories and will lead you to overeating and weight gain in the long run) - do not skip breakfast or other meals (this will slow down the metabolism and will result in more weight gain over time)  - can try smoothies made from fruit and almond/rice milk in am instead of regular breakfast - can also try old-fashioned (not instant) oatmeal made with almond/rice milk in am - order the dressing on the side when eating salad at a restaurant (pour less than half of the dressing on the salad) - eat as little meat as possible - can try juicing, but should not forget that juicing will get rid of the fiber, so would alternate with eating raw veg./fruits or drinking smoothies - use as little oil as possible, even when using olive oil - can dress a salad with a mix of balsamic vinegar and lemon juice, for e.g. - use agave nectar, stevia sugar, or regular sugar rather than artificial sweateners - steam or broil/roast veggies  - snack on veggies/fruit/nuts (unsalted, preferably) when possible, rather than processed  foods - reduce or eliminate aspartame in diet (it is in diet sodas, chewing gum, etc) Read the labels!  Try to read Dr. Katherina RightNeal Barnard's book: "Program for Reversing Diabetes" for the vegan concept and other ideas for healthy eating.  Plant-based diet materials: - Lectures (you tube):  Lequita AsalNeal Barnard: "Breaking the Food Seduction"  Doug Lisle: "How to Lose Weight, without Losing Your Mind"  Lucile CraterRodney Snow: "What is Insulin Resistance" TucsonEntrepreneur.sihttp://www.youtube.com/watch?v=k5iM6A67z3Y - Documentaries:  Supersize Me  Food Inc[employer]  Forks over BorgWarnerKnives  Vegucated  Fat, Sick and Nearly Dead  The Edison InternationalWeight of the Nationwide Mutual Insuranceation - Books:  Lequita AsalNeal Barnard: "Program for Reversing Diabetes"  Ferol LuzColin Campbell: "The Armeniahina Study"  Konrad PentaDonna Klein: "Supermarket Vegan" (cookbook) - Facebook pages:   Reece AgarForks versus Knives  Vegucated  Toys ''R'' UsVegNews Magazine  Food Matters - Healthy nutrition info websites:  LateTelevision.com.eehttp://nutritionfacts.org/

## 2014-06-16 NOTE — Progress Notes (Signed)
Patient ID: Tracey Reyes, female   DOB: October 20, 1986, 28 y.o.   MRN: [id number]  HPI: Tracey Reyes is a 28 y.o. female, referred by Dr. Billy Coast, for evaluation for PCOS and Hashimoto's thyroiditis.  PCOS: - dx 2010-2011 - + hot flashes, sweating - she has alopecia areata (tellogen effluvium) - Minoxidil did nor help, tried shampoos  Weight gain: - last 2 years, gained ~15 lbs - no steroid use - no weight loss meds, but tried phentermine >> tachycardia >> stopped it - Meals: - Breakfast: cereal or bagel - Lunch: soup or sandwich  - Dinner:  - Snacks:  Drinks:  - Diets tried: many; cut out sodas, bread - Exercise: walking, 3-4 x a week  Fertility/Menstrual cycles: - irregular menses - no h/o ovarian cysts - G3P1(08/2012) A (spont)2 - contraception: OCP - Apri  Acne: - chin, better  Hirsutism: - chin, dark, upper lip - in mother, also - around nipples sometimes  Treatments tried: - she was on Metformin XR >> dizziness - did not try Spironolactone - did not try [place] - is on OCPs ([place])  Other meds: - SSRIs: no  - Testosterone 93 (8-48) and free Testosterone 0.4 (0-2.2) in 05/2013.  - Last set of lipids: not available - Last HbA1c: not available - Last CBC: normal 05/2013 - Last CMP: normal 05/2013  Euthyroid Hashimoto's thyroiditis This was dx in 2003 (28 y/o). She was briefly on Synthroid, then decreased.   She is not on LT4.  Recent TFTs (per records from Dr Billy Coast):  - 08/12/2013: TSH 2.13, fT4 1.08 (0.82-1.77), fT3 3.6 (2-4.4) - 05/24/2013: TSH 2.5, fT4 1.16, fT3 3.6  No thyroid ds in family.   Has FH of DM in MGM.  ROS: Constitutional: + weight gain, + fatigue, + subjective hyperthermia,  +poor sleep, + nocturia Eyes: no blurry vision, no xerophthalmia ENT: no sore throat, no nodules palpated in throat, no dysphagia/odynophagia, no hoarseness Cardiovascular: no CP/SOB/palpitations/leg swelling Respiratory: no  cough/SOB Gastrointestinal:+ N/+ V/+ D/no C Musculoskeletal: no muscle/+ joint aches Skin: + acne on chin, + hair loss Neurological: no tremors/numbness/tingling/dizziness Psychiatric: + both depression/anxiety  Past Medical History  Diagnosis Date  . Dysautonomia orthostatic hypotension syndrome     with frequent sinus tachycardia and prior syncope  . Thyroiditis     a. h/o hoshimoto's  . Asthma   . Depression   . Eczema   . PCOS (polycystic ovarian syndrome)   . Gallstones     a. on actigall  . Syncope     a. 03/2010 Echo NL EF, Gr 1 DD;  b. 03/2012 in setting of palpitations/pregnancy;  c. echo 04/02/12: EF 60-65% , felt to be due to dysautonomia  . H/O varicella   . Hashimoto's disease   . Abnormal Pap smear   . Cholecystitis   . Chronic kidney disease     stones since a child  . PONV (postoperative nausea and vomiting)   . S/P cesarean section (9/23, FTP) 08/31/2012  . Gestational thrombocytopenia 08/31/2012  . Postpartum care following cesarean delivery 08/31/2012  . Rh negative, maternal 09/02/2012  . Anxiety   . Frequent UTI   . Anemia    Past Surgical History  Procedure Laterality Date  . Dilation and curettage of uterus      after miscarriage 1/11  . Appendectomy    . Knee arthroscopy  2001    right  . Cesarean section  08/30/2012    Procedure: CESAREAN SECTION;  Surgeon: Lenoard Aden,  MD;  Location: [facility];  Service: Obstetrics;  Laterality: N/A;  . Umbilical hernia repair  11/25/2012    Procedure: HERNIA REPAIR UMBILICAL ADULT;  Surgeon: Kandis Cockingavid H Newman, MD;  [facility];  Service: General;  Laterality: N/A;  . Cholecystectomy  11/25/2012    Procedure: LAPAROSCOPIC CHOLECYSTECTOMY;  Surgeon: Kandis Cockingavid H Newman, MD;  [facility];  Service: General;;   History   Social History  . Marital Status: Married    Spouse Name: N/A    Number of Children: 1   Social History Main Topics  . Smoking status: Never Smoker   . Smokeless tobacco: Not on file      Comment: tobacco use - no  . Alcohol Use:     1-2 Glasses of wine per week     Comment: occasionall; socially  . Drug Use: No  . 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   Graduated from [employer] 2008.   Current Outpatient Prescriptions on File Prior to Visit  Medication Sig Dispense Refill  . albuterol (VENTOLIN HFA) 108 (90 BASE) MCG/ACT inhaler Inhale 1 puff into the lungs every 6 (six) hours as needed. For shortness of breath        No current facility-administered medications on file prior to visit.   Allergies  Allergen Reactions  . Pertussis Vaccines Other (See Comments)    Fever 103F, flu-like symptoms  . Sulfonamide Derivatives Hives  . Ceclor [Cefaclor] Hives  . Cranberry Hives    Also blackberry and raspberrsy  . Dexlansoprazole Itching  . Nitrofurantoin Hives  . Penicillins Hives   Family History  Problem Relation Age of Onset  . Hyperlipidemia Mother   . Depression Mother   . Hyperlipidemia Father   . Diabetes Paternal Grandmother    PE: BP 104/62  Pulse 96  Temp(Src) 98.7 F (37.1 C) (Oral)  Resp 12  Ht 5\' 1"  (1.549 m)  Wt 173 lb 12.8 oz (78.835 kg)  BMI 32.86 kg/m2  SpO2 98% Wt Readings from Last 3 Encounters:  06/16/14 173 lb 12.8 oz (78.835 kg)  07/19/13 175 lb 6.4 oz (79.561 kg)  12/10/12 161 lb (73.029 kg)   Constitutional: overweight, in NAD, no full supraclavicular fat pads Eyes: PERRLA, EOMI, no exophthalmos ENT: moist mucous membranes, no thyromegaly, no cervical lymphadenopathy Cardiovascular: RRR, No MRG Respiratory: CTA B Gastrointestinal: abdomen soft, NT, ND, BS+ Musculoskeletal: no deformities, strength intact in all 4 Skin: moist, warm; + acne on face, no acanthosis nigricans, no purple, wide, stretch marks, no skin tags Neurological: no tremor with outstretched hands, DTR normal in all 4  ASSESSMENT: 1. PCOS  2. Hashimoto's thyroiditis -  euthyroid  PLAN: 1. PCOS I had a long discussion with the patient about the fact that the PCOS is a misnomer, a patient does not necessarily have to have polycystic ovaries to be diagnosed with the disorder. This is of sum of several conditions, including:  weight gain  insulin resistance (and therefore a higher risk of developing diabetes later in life)  acne  hirsutism  irregular menstrual cycles  decreased fertility. - We also discussed about the fact that the treatment is usually targeted to addressing the problem that concerns the patient the most: acne/hirsutism, weight gain, or fertility, but there is no single treatment for PCOS. She is mostly bothered by hot flushes and her weight gain. - The first-line therapy are oral contraceptives. If  she is concerned with her weight, we can use metformin (however, she has dizziness from Metformin; if she is concerned about acne/hirsutism, we can add spironolactone (there are not very bothersome for her and would like to avoid this since she has BP issues); and if she is concerned about fertility, I could refer her to reproductive endocrinology for possible use of clomiphene (no pregnancy plans yet). - we will check: Hba1c, lipids and testosterone level today - I suggested to switch to a more continuous OCP >> sent Seasonique to her pharmacy. Lybrel would be best but likely not covered by her insurance - discussed at length about diet >> made specific suggestions about ways to improve her diet - see pt instructions - will have her back in 6 mo   2. Hashimoto's thyroiditis - last 2 sets of TFTs were normal - will recheck them today  Component     Latest Ref Rng 06/16/2014  Cholesterol     0 - 200 mg/dL 161  Triglycerides     0.0 - 149.0 mg/dL 096.0  HDL     >45.40 mg/dL 98.11  VLDL     0.0 - 91.4 mg/dL 78.2  LDL (calc)     0 - 99 mg/dL 956 (H)  Total CHOL/HDL Ratio      4  NonHDL      132.10  Testosterone     10 - 70 ng/dL 69   Sex Hormone Binding     18 - 114 nmol/L 239 (H)  Testosterone Free     0.6 - 6.8 pg/mL 2.6  Testosterone-% Free     0.4 - 2.4 % 0.4  TSH     0.35 - 4.50 uIU/mL 0.52  Free T4     0.60 - 1.60 ng/dL 2.13  T3, Free     2.3 - 4.2 pg/mL 3.5  Hemoglobin A1C     4.6 - 6.5 % 5.3   Normal TFTs >> Euthyroid Hashimoto's thyroiditis confirmed. No DM or prediabetes. Normal testosterone. High SHBG, expected 2/2 tx with OCPs.  Lipid panel normal.

## 2014-06-19 LAB — TESTOSTERONE, FREE, TOTAL, SHBG
SEX HORMONE BINDING: 239 nmol/L — AB (ref 18–114)
TESTOSTERONE: 69 ng/dL (ref 10–70)
Testosterone, Free: 2.6 pg/mL (ref 0.6–6.8)
Testosterone-% Free: 0.4 % (ref 0.4–2.4)

## 2014-10-09 ENCOUNTER — Encounter: Payer: Self-pay | Admitting: Internal Medicine

## 2014-12-22 ENCOUNTER — Ambulatory Visit: Payer: Managed Care, Other (non HMO) | Admitting: Internal Medicine

## 2015-02-02 ENCOUNTER — Ambulatory Visit: Payer: Managed Care, Other (non HMO) | Admitting: Internal Medicine

## 2015-03-02 ENCOUNTER — Ambulatory Visit: Payer: Managed Care, Other (non HMO) | Admitting: Internal Medicine

## 2015-04-20 ENCOUNTER — Ambulatory Visit: Payer: Managed Care, Other (non HMO) | Admitting: Internal Medicine
